# Patient Record
Sex: Female | Born: 1955 | Race: White | Hispanic: No | Marital: Married | Smoking: Current every day smoker
Health system: Southern US, Community
[De-identification: ages and names within clinical notes are randomized; demographics above are authoritative.]

## PROBLEM LIST (undated history)

## (undated) ENCOUNTER — Emergency Department: Discharge status: Absent without leave | Payer: Medicaid Other

## (undated) DIAGNOSIS — J349 Unspecified disorder of nose and nasal sinuses: Secondary | ICD-10-CM

## (undated) DIAGNOSIS — F410 Panic disorder [episodic paroxysmal anxiety] without agoraphobia: Secondary | ICD-10-CM

## (undated) DIAGNOSIS — K319 Disease of stomach and duodenum, unspecified: Secondary | ICD-10-CM

## (undated) DIAGNOSIS — E785 Hyperlipidemia, unspecified: Secondary | ICD-10-CM

## (undated) DIAGNOSIS — F32A Depression, unspecified: Secondary | ICD-10-CM

## (undated) DIAGNOSIS — K210 Gastro-esophageal reflux disease with esophagitis: Secondary | ICD-10-CM

## (undated) DIAGNOSIS — M797 Fibromyalgia: Secondary | ICD-10-CM

## (undated) DIAGNOSIS — F329 Major depressive disorder, single episode, unspecified: Secondary | ICD-10-CM

## (undated) DIAGNOSIS — K219 Gastro-esophageal reflux disease without esophagitis: Secondary | ICD-10-CM

## (undated) DIAGNOSIS — F419 Anxiety disorder, unspecified: Secondary | ICD-10-CM

## (undated) DIAGNOSIS — K589 Irritable bowel syndrome without diarrhea: Secondary | ICD-10-CM

## (undated) HISTORY — DX: Gastro-esophageal reflux disease without esophagitis: K21.9

## (undated) HISTORY — DX: Panic disorder (episodic paroxysmal anxiety): F41.0

## (undated) HISTORY — DX: Major depressive disorder, single episode, unspecified: F32.9

## (undated) HISTORY — PX: ABDOMINAL HYSTERECTOMY: SHX81

## (undated) HISTORY — DX: Hyperlipidemia, unspecified: E78.5

## (undated) HISTORY — PX: HEMORRHOID SURGERY: SHX153

## (undated) HISTORY — DX: Fibromyalgia: M79.7

## (undated) HISTORY — DX: Gastro-esophageal reflux disease with esophagitis: K21.0

## (undated) HISTORY — PX: ACHILLES TENDON REPAIR: SUR1153

## (undated) HISTORY — DX: Disease of stomach and duodenum, unspecified: K31.9

## (undated) HISTORY — PX: TONSILLECTOMY AND ADENOIDECTOMY: SUR1326

## (undated) HISTORY — DX: Depression, unspecified: F32.A

## (undated) HISTORY — DX: Irritable bowel syndrome without diarrhea: K58.9

## (undated) HISTORY — PX: KNEE ARTHROSCOPY: SHX127

## (undated) HISTORY — PX: SHOULDER ARTHROSCOPY: SHX128

---

## 1997-05-08 ENCOUNTER — Other Ambulatory Visit: Admission: RE | Admit: 1997-05-08 | Discharge: 1997-05-08 | Payer: Self-pay | Admitting: Sports Medicine

## 1999-06-18 ENCOUNTER — Other Ambulatory Visit: Admission: RE | Admit: 1999-06-18 | Discharge: 1999-06-18 | Payer: Self-pay | Admitting: Obstetrics and Gynecology

## 2000-08-10 ENCOUNTER — Ambulatory Visit (HOSPITAL_COMMUNITY): Admission: RE | Admit: 2000-08-10 | Discharge: 2000-08-10 | Payer: Self-pay | Admitting: Cardiology

## 2001-04-19 ENCOUNTER — Other Ambulatory Visit: Admission: RE | Admit: 2001-04-19 | Discharge: 2001-04-19 | Payer: Self-pay | Admitting: Obstetrics and Gynecology

## 2001-08-24 ENCOUNTER — Ambulatory Visit (HOSPITAL_BASED_OUTPATIENT_CLINIC_OR_DEPARTMENT_OTHER): Admission: RE | Admit: 2001-08-24 | Discharge: 2001-08-24 | Payer: Self-pay | Admitting: Orthopedic Surgery

## 2003-08-31 ENCOUNTER — Encounter: Payer: Self-pay | Admitting: Gastroenterology

## 2003-08-31 DIAGNOSIS — K2100 Gastro-esophageal reflux disease with esophagitis, without bleeding: Secondary | ICD-10-CM

## 2003-08-31 DIAGNOSIS — K219 Gastro-esophageal reflux disease without esophagitis: Secondary | ICD-10-CM

## 2003-08-31 HISTORY — DX: Gastro-esophageal reflux disease without esophagitis: K21.9

## 2003-08-31 HISTORY — DX: Gastro-esophageal reflux disease with esophagitis, without bleeding: K21.00

## 2006-08-18 ENCOUNTER — Ambulatory Visit (HOSPITAL_COMMUNITY): Admission: RE | Admit: 2006-08-18 | Discharge: 2006-08-18 | Payer: Self-pay | Admitting: Cardiovascular Disease

## 2008-06-05 ENCOUNTER — Telehealth: Payer: Self-pay | Admitting: Gastroenterology

## 2008-06-13 ENCOUNTER — Telehealth: Payer: Self-pay | Admitting: Gastroenterology

## 2008-06-14 DIAGNOSIS — F410 Panic disorder [episodic paroxysmal anxiety] without agoraphobia: Secondary | ICD-10-CM | POA: Insufficient documentation

## 2008-06-14 DIAGNOSIS — K210 Gastro-esophageal reflux disease with esophagitis: Secondary | ICD-10-CM

## 2008-06-14 DIAGNOSIS — R110 Nausea: Secondary | ICD-10-CM

## 2008-06-14 DIAGNOSIS — F411 Generalized anxiety disorder: Secondary | ICD-10-CM | POA: Insufficient documentation

## 2008-06-14 DIAGNOSIS — F329 Major depressive disorder, single episode, unspecified: Secondary | ICD-10-CM

## 2008-06-14 DIAGNOSIS — K219 Gastro-esophageal reflux disease without esophagitis: Secondary | ICD-10-CM | POA: Insufficient documentation

## 2008-06-14 DIAGNOSIS — IMO0001 Reserved for inherently not codable concepts without codable children: Secondary | ICD-10-CM

## 2008-06-15 ENCOUNTER — Ambulatory Visit: Payer: Self-pay | Admitting: Gastroenterology

## 2008-07-25 ENCOUNTER — Ambulatory Visit: Payer: Self-pay | Admitting: Gastroenterology

## 2008-07-25 DIAGNOSIS — E785 Hyperlipidemia, unspecified: Secondary | ICD-10-CM

## 2008-07-25 DIAGNOSIS — H9209 Otalgia, unspecified ear: Secondary | ICD-10-CM | POA: Insufficient documentation

## 2008-07-25 DIAGNOSIS — R197 Diarrhea, unspecified: Secondary | ICD-10-CM

## 2008-07-25 DIAGNOSIS — R1110 Vomiting, unspecified: Secondary | ICD-10-CM

## 2008-07-25 DIAGNOSIS — K589 Irritable bowel syndrome without diarrhea: Secondary | ICD-10-CM | POA: Insufficient documentation

## 2008-07-25 DIAGNOSIS — R079 Chest pain, unspecified: Secondary | ICD-10-CM

## 2008-07-26 ENCOUNTER — Telehealth: Payer: Self-pay | Admitting: Gastroenterology

## 2008-07-26 LAB — CONVERTED CEMR LAB
AST: 18 units/L (ref 0–37)
BUN: 9 mg/dL (ref 6–23)
Basophils Relative: 0.1 % (ref 0.0–3.0)
Bilirubin, Direct: 0.1 mg/dL (ref 0.0–0.3)
CO2: 30 meq/L (ref 19–32)
Eosinophils Relative: 2.2 % (ref 0.0–5.0)
Ferritin: 18.2 ng/mL (ref 10.0–291.0)
Glucose, Bld: 94 mg/dL (ref 70–99)
HCT: 43.2 % (ref 36.0–46.0)
Lymphocytes Relative: 17.3 % (ref 12.0–46.0)
Lymphs Abs: 1.5 10*3/uL (ref 0.7–4.0)
MCV: 92.7 fL (ref 78.0–100.0)
Monocytes Absolute: 0.2 10*3/uL (ref 0.1–1.0)
Monocytes Relative: 2.8 % — ABNORMAL LOW (ref 3.0–12.0)
Neutro Abs: 6.7 10*3/uL (ref 1.4–7.7)
Neutrophils Relative %: 77.6 % — ABNORMAL HIGH (ref 43.0–77.0)
Platelets: 275 10*3/uL (ref 150.0–400.0)
RBC: 4.66 M/uL (ref 3.87–5.11)
Saturation Ratios: 20 % (ref 20.0–50.0)
Sodium: 144 meq/L (ref 135–145)
Total Bilirubin: 0.8 mg/dL (ref 0.3–1.2)
Total Protein: 7.1 g/dL (ref 6.0–8.3)
Transferrin: 297.1 mg/dL (ref 212.0–360.0)

## 2008-08-14 ENCOUNTER — Telehealth: Payer: Self-pay | Admitting: Gastroenterology

## 2008-08-17 ENCOUNTER — Telehealth: Payer: Self-pay | Admitting: Gastroenterology

## 2008-09-01 ENCOUNTER — Telehealth: Payer: Self-pay | Admitting: Gastroenterology

## 2008-09-06 ENCOUNTER — Encounter: Payer: Self-pay | Admitting: Gastroenterology

## 2008-09-06 ENCOUNTER — Ambulatory Visit: Payer: Self-pay | Admitting: Gastroenterology

## 2008-09-06 DIAGNOSIS — K319 Disease of stomach and duodenum, unspecified: Secondary | ICD-10-CM

## 2008-09-06 DIAGNOSIS — K2970 Gastritis, unspecified, without bleeding: Secondary | ICD-10-CM | POA: Insufficient documentation

## 2008-09-06 DIAGNOSIS — K2990 Gastroduodenitis, unspecified, without bleeding: Secondary | ICD-10-CM

## 2008-09-06 HISTORY — DX: Disease of stomach and duodenum, unspecified: K31.9

## 2008-09-07 ENCOUNTER — Telehealth: Payer: Self-pay | Admitting: Gastroenterology

## 2008-09-07 ENCOUNTER — Telehealth (INDEPENDENT_AMBULATORY_CARE_PROVIDER_SITE_OTHER): Payer: Self-pay

## 2008-09-08 ENCOUNTER — Encounter: Payer: Self-pay | Admitting: Gastroenterology

## 2008-09-11 ENCOUNTER — Telehealth: Payer: Self-pay | Admitting: Gastroenterology

## 2008-09-12 ENCOUNTER — Telehealth: Payer: Self-pay | Admitting: Gastroenterology

## 2009-04-03 ENCOUNTER — Encounter: Admission: RE | Admit: 2009-04-03 | Discharge: 2009-04-03 | Payer: Self-pay | Admitting: Family Medicine

## 2009-04-03 ENCOUNTER — Encounter: Admission: RE | Admit: 2009-04-03 | Discharge: 2009-04-03 | Payer: Self-pay | Admitting: Physician Assistant

## 2009-04-05 ENCOUNTER — Encounter: Admission: RE | Admit: 2009-04-05 | Discharge: 2009-04-05 | Payer: Self-pay | Admitting: Family Medicine

## 2009-09-05 ENCOUNTER — Encounter: Admission: RE | Admit: 2009-09-05 | Discharge: 2009-09-05 | Payer: Self-pay | Admitting: Physician Assistant

## 2010-02-10 ENCOUNTER — Encounter: Payer: Self-pay | Admitting: Physician Assistant

## 2010-04-29 ENCOUNTER — Other Ambulatory Visit: Payer: Self-pay | Admitting: Family Medicine

## 2010-05-30 ENCOUNTER — Other Ambulatory Visit: Payer: Self-pay | Admitting: Physician Assistant

## 2010-05-30 DIAGNOSIS — Z1231 Encounter for screening mammogram for malignant neoplasm of breast: Secondary | ICD-10-CM

## 2010-05-30 DIAGNOSIS — Z09 Encounter for follow-up examination after completed treatment for conditions other than malignant neoplasm: Secondary | ICD-10-CM

## 2010-06-04 NOTE — Cardiovascular Report (Signed)
Bianca Reilly, Bianca Reilly               ACCOUNT NO.:  [id number]   MEDICAL RECORD NO.:  [id number]          PATIENT TYPE:  OIB   LOCATION:  2852                         FACILITY:  [facility]   PHYSICIAN:  Ricki Rodriguez, M.D.  DATE OF BIRTH:  Jul 18, 1955   DATE OF PROCEDURE:  08/18/2006  DATE OF DISCHARGE:                            CARDIAC CATHETERIZATION   Procedure done by Dr. Orpah Cobb.   PROCEDURE:  1. Left heart catheterization.  2. Selective coronary angiography.  3. Left renal function study.   INDICATION:  This is a 55 year old white female with a recurrent chest  pain for two weeks with family history of coronary artery disease and  abnormal EKG and smoking as the risk factors.   APPROACH:  Right femoral artery using a 4-French sheath.   COMPLICATIONS:  None.   Less than 55 mL of dye was used.   HEMODYNAMIC DATA:  The left ventricle pressure was 152/14 and aortic  pressure was 158/82.   LEFT VENTRICULOGRAM:  The left ventriculogram showed normal left  ventricular systolic function with ejection fraction of 60-65%.   CORONARY ANATOMY:  The left main coronary artery was unremarkable.   Left Anterior Descending Coronary Artery:  The anterior descending  coronary artery showed lumen irregularities in the proximal 1/3 of the  vessel.  The rest of the vessel was unremarkable.  Each diagonal branch  was also unremarkable.   Left Circumflex Coronary Artery:  The left circumflex coronary artery  was small and unremarkable.  Its obtuse marginal branch 1 and 2 were  normal.   Right Coronary Artery:  The right coronary artery was dominant and  normal and its posterolateral branch was a large vessel and posterior  descending coronary artery was also unremarkable.   IMPRESSION:  1. Minimal coronary artery disease.  2. Normal left renal systolic function.   RECOMMENDATIONS:  This patient will be treated medically for her  noncardiac chest pain.      Ricki Rodriguez,  M.D.  Electronically Signed     ASK/MEDQ  D:  08/18/2006  T:  08/18/2006  Job:  [id number]

## 2010-06-11 ENCOUNTER — Ambulatory Visit
Admission: RE | Admit: 2010-06-11 | Discharge: 2010-06-11 | Disposition: A | Discharge status: Home / Self Care | Payer: Medicaid Other | Source: Ambulatory Visit | Attending: Physician Assistant | Admitting: Physician Assistant

## 2010-06-11 ENCOUNTER — Ambulatory Visit: Payer: Self-pay

## 2010-06-11 ENCOUNTER — Other Ambulatory Visit: Payer: Self-pay | Admitting: Physician Assistant

## 2010-06-11 DIAGNOSIS — Z09 Encounter for follow-up examination after completed treatment for conditions other than malignant neoplasm: Secondary | ICD-10-CM

## 2010-11-04 LAB — HEMOGLOBIN A1C
Hgb A1c MFr Bld: 5.5
Mean Plasma Glucose: 119

## 2010-11-04 LAB — CBC
HCT: 40.1
Hemoglobin: 13.9
MCHC: 34.5
MCV: 91.1
RBC: 4.4
WBC: 8.1

## 2010-11-04 LAB — BASIC METABOLIC PANEL
CO2: 25
Chloride: 107
GFR calc Af Amer: >60
Potassium: 3.8

## 2010-11-04 LAB — LIPID PANEL
Total CHOL/HDL Ratio: 4.6
VLDL: 20

## 2010-11-04 LAB — PROTIME-INR
INR: 0.9
Prothrombin Time: 12.7

## 2011-07-01 ENCOUNTER — Emergency Department (HOSPITAL_COMMUNITY)
Admission: EM | Admit: 2011-07-01 | Discharge: 2011-07-02 | Disposition: A | Discharge status: Home / Self Care | Payer: Medicaid - Out of State | Attending: Emergency Medicine | Admitting: Emergency Medicine

## 2011-07-01 ENCOUNTER — Emergency Department (HOSPITAL_COMMUNITY): Payer: Medicaid - Out of State

## 2011-07-01 ENCOUNTER — Encounter (HOSPITAL_COMMUNITY): Payer: Self-pay | Admitting: *Deleted

## 2011-07-01 DIAGNOSIS — J329 Chronic sinusitis, unspecified: Secondary | ICD-10-CM | POA: Insufficient documentation

## 2011-07-01 DIAGNOSIS — E7800 Pure hypercholesterolemia, unspecified: Secondary | ICD-10-CM | POA: Insufficient documentation

## 2011-07-01 DIAGNOSIS — J069 Acute upper respiratory infection, unspecified: Secondary | ICD-10-CM | POA: Insufficient documentation

## 2011-07-01 DIAGNOSIS — F17200 Nicotine dependence, unspecified, uncomplicated: Secondary | ICD-10-CM | POA: Insufficient documentation

## 2011-07-01 HISTORY — DX: Anxiety disorder, unspecified: F41.9

## 2011-07-01 NOTE — ED Notes (Signed)
Pt called from triage to draw labs but no answer.

## 2011-07-01 NOTE — ED Notes (Signed)
Pt states has been weak for months; c/o heart feeling like it isn't beating right; c/o acid reflux; pt c/o heart racing; pain across front of chest and radiating to back; states she is concerned about bronchitis/pneumonia

## 2011-07-02 NOTE — ED Provider Notes (Signed)
History     CSN: [id number]  Arrival date & time 07/01/11  2300   None     Chief Complaint  Patient presents with  . Chest Pain    (Consider location/radiation/quality/duration/timing/severity/associated sxs/prior treatment) The history is provided by the patient.   has been feeling sick the last few weeks and worse her last few days having cough, sinus congestion, with history of bronchitis and symptoms feel the same. Subjective fevers at home with no measured temperature. Taking Mucinex and ibuprofen without relief. States last time she had the symptoms was put on antibiotics that helped. Moderate in severity. No shortness of breath. Has some chest pain with coughing but no pain otherwise. Pain is sharp in quality and occasionally radiates to her back. No hemoptysis. No nausea vomiting or diarrhea. No sore throat. No voice changes. In the emergency with family member who is also sick.  Past Medical History  Diagnosis Date  . Hypercholesteremia   . Anxiety     Past Surgical History  Procedure Date  . Knee arthroscopy   . Achilles tendon repair   . Abdominal hysterectomy   . Tonsillectomy   . Shoulder arthroscopy     No family history on file.  History  Substance Use Topics  . Smoking status: Current Everyday Smoker -- 1.0 packs/day  . Smokeless tobacco: Not on file  . Alcohol Use: No    OB History    Grav Para Term Preterm Abortions TAB SAB Ect Mult Living                  Review of Systems  Constitutional: Negative for fever and chills.  HENT: Positive for congestion and sinus pressure. Negative for ear pain, nosebleeds, sore throat, trouble swallowing, neck pain, neck stiffness and voice change.   Eyes: Negative for pain.  Respiratory: Positive for cough. Negative for shortness of breath.   Cardiovascular: Positive for chest pain.  Gastrointestinal: Negative for abdominal pain.  Genitourinary: Negative for dysuria.  Musculoskeletal: Negative for back pain.    Skin: Negative for rash.  Neurological: Negative for headaches.  All other systems reviewed and are negative.    Allergies  Ivp dye  Home Medications   Current Outpatient Rx  Name Route Sig Dispense Refill  . IBUPROFEN 800 MG PO TABS Oral Take 800 mg by mouth every 8 (eight) hours as needed. For pain    . PSEUDOEPHEDRINE-GUAIFENESIN ER 60-600 MG PO TB12 Oral Take 1 tablet by mouth every 12 (twelve) hours.      BP 131/74  Pulse 80  Temp 98.9 F (37.2 C) (Oral)  Resp 20  Wt 133 lb (60.328 kg)  SpO2 98%  Physical Exam  Constitutional: She is oriented to person, place, and time. She appears well-developed and well-nourished.  HENT:  Head: Normocephalic and atraumatic.  Mouth/Throat: Oropharynx is clear and moist. No oropharyngeal exudate.       Tender over maxillary sinuses, nasal congestion  Eyes: Conjunctivae and EOM are normal. Pupils are equal, round, and reactive to light.  Neck: Trachea normal. Neck supple. No thyromegaly present.  Cardiovascular: Normal rate, regular rhythm, S1 normal, S2 normal and normal pulses.     No systolic murmur is present   No diastolic murmur is present  Pulses:      Radial pulses are 2+ on the right side, and 2+ on the left side.  Pulmonary/Chest: Effort normal and breath sounds normal. She has no wheezes. She has no rhonchi. She has no rales. She  exhibits no tenderness.  Abdominal: Soft. Normal appearance and bowel sounds are normal. There is no tenderness. There is no CVA tenderness and negative Murphy's sign.  Musculoskeletal:       BLE:s Calves nontender, no cords or erythema, negative Homans sign  Neurological: She is alert and oriented to person, place, and time. She has normal strength. No cranial nerve deficit or sensory deficit. GCS eye subscore is 4. GCS verbal subscore is 5. GCS motor subscore is 6.  Skin: Skin is warm and dry. No rash noted. She is not diaphoretic.  Psychiatric: Her speech is normal.       Cooperative and  appropriate    ED Course  Procedures (including critical care time)   Labs Reviewed  LAB REPORT - SCANNED   Dg Chest 2 View  07/02/2011  *RADIOLOGY REPORT*  Clinical Data: Chest pain.  Smoker.  Shortness of breath for several days.  CHEST - 2 VIEW  Comparison: 04/03/2009  Findings: Midline trachea.  Normal heart size and mediastinal contours. No pleural effusion or pneumothorax.  Diffuse peribronchial thickening.  Apparent nodular density projecting over the right upper lobe is likely associated with an EKG lead.  The left lung is clear.  IMPRESSION:  1. No acute cardiopulmonary disease. 2. Peribronchial thickening which may relate to chronic bronchitis or smoking. 3.  Probable artifactual density projecting over the right upper lobe.  Consider repeat frontal film after removal of EKG leads.  Original Report Authenticated By: Consuello Bossier, M.D.     Date: 07/02/2011  Rate: 77  Rhythm: normal sinus rhythm  QRS Axis: normal  Intervals: normal  ST/T Wave abnormalities: nonspecific ST changes  Conduction Disutrbances:none  Narrative Interpretation: artifact present  Old EKG Reviewed: none available     MDM   Clinical sinusitis/ URI.  History of bronchitis. Active smoker. EKG and x-ray reviewed as above. No pneumonia. Symptoms do not suggest ACS. Prescriptions provided. No hypoxia or indication for admission or further workup at this time. Plan outpatient followup as needed        Sunnie Nielsen, MD 07/03/11 [phone]

## 2011-07-02 NOTE — ED Notes (Signed)
Pt called from triage for lab draw but no answer x2

## 2011-07-02 NOTE — ED Notes (Signed)
See paper chart 

## 2011-07-15 ENCOUNTER — Telehealth: Payer: Self-pay | Admitting: Gastroenterology

## 2011-07-15 ENCOUNTER — Ambulatory Visit: Payer: Medicaid Other | Admitting: Gastroenterology

## 2011-07-15 NOTE — Telephone Encounter (Signed)
Dr Jarold Motto, would you like to charge late cancellation fee?

## 2011-07-15 NOTE — Telephone Encounter (Signed)
yes

## 2011-08-05 ENCOUNTER — Other Ambulatory Visit: Payer: Self-pay | Admitting: Family Medicine

## 2011-08-05 DIAGNOSIS — R921 Mammographic calcification found on diagnostic imaging of breast: Secondary | ICD-10-CM

## 2011-08-12 ENCOUNTER — Encounter: Payer: Self-pay | Admitting: *Deleted

## 2011-08-12 ENCOUNTER — Ambulatory Visit
Admission: RE | Admit: 2011-08-12 | Discharge: 2011-08-12 | Disposition: A | Discharge status: Home / Self Care | Payer: Medicaid Other | Source: Ambulatory Visit | Attending: Family Medicine | Admitting: Family Medicine

## 2011-08-12 DIAGNOSIS — R921 Mammographic calcification found on diagnostic imaging of breast: Secondary | ICD-10-CM

## 2011-08-15 ENCOUNTER — Ambulatory Visit (INDEPENDENT_AMBULATORY_CARE_PROVIDER_SITE_OTHER): Payer: Medicaid Other | Admitting: Gastroenterology

## 2011-08-15 ENCOUNTER — Other Ambulatory Visit: Payer: Self-pay | Admitting: Gastroenterology

## 2011-08-15 ENCOUNTER — Ambulatory Visit: Payer: Medicaid Other

## 2011-08-15 ENCOUNTER — Other Ambulatory Visit: Payer: Self-pay

## 2011-08-15 ENCOUNTER — Encounter: Payer: Self-pay | Admitting: Gastroenterology

## 2011-08-15 VITALS — BP 100/70 | HR 80 | Ht 63.0 in | Wt 136.0 lb

## 2011-08-15 DIAGNOSIS — R1110 Vomiting, unspecified: Secondary | ICD-10-CM

## 2011-08-15 DIAGNOSIS — R109 Unspecified abdominal pain: Secondary | ICD-10-CM

## 2011-08-15 DIAGNOSIS — R110 Nausea: Secondary | ICD-10-CM

## 2011-08-15 DIAGNOSIS — R197 Diarrhea, unspecified: Secondary | ICD-10-CM

## 2011-08-15 DIAGNOSIS — K589 Irritable bowel syndrome without diarrhea: Secondary | ICD-10-CM

## 2011-08-15 LAB — CBC WITH DIFFERENTIAL/PLATELET
Basophils Absolute: 0.1 10*3/uL (ref 0.0–0.1)
Eosinophils Absolute: 0.2 10*3/uL (ref 0.0–0.7)
Hemoglobin: 14.9 g/dL (ref 12.0–15.0)
Lymphocytes Relative: 22.6 % (ref 12.0–46.0)
Lymphs Abs: 1.8 10*3/uL (ref 0.7–4.0)
MCHC: 34.1 g/dL (ref 30.0–36.0)
Neutro Abs: 5.3 10*3/uL (ref 1.4–7.7)
Platelets: 347 10*3/uL (ref 150.0–400.0)
RDW: 13.3 % (ref 11.5–14.6)

## 2011-08-15 LAB — FOLATE: Folate: 9.7 ng/mL (ref >5.9)

## 2011-08-15 LAB — BASIC METABOLIC PANEL
BUN: 10 mg/dL (ref 6–23)
CO2: 27 mEq/L (ref 19–32)
Calcium: 9.8 mg/dL (ref 8.4–10.5)
Glucose, Bld: 65 mg/dL — ABNORMAL LOW (ref 70–99)
Sodium: 142 mEq/L (ref 135–145)

## 2011-08-15 LAB — C-REACTIVE PROTEIN: CRP: <1 mg/dL (ref 1–20)

## 2011-08-15 LAB — HEPATIC FUNCTION PANEL
Albumin: 4.4 g/dL (ref 3.5–5.2)
Total Protein: 7.6 g/dL (ref 6.0–8.3)

## 2011-08-15 LAB — SEDIMENTATION RATE: Sed Rate: 19 mm/hr (ref 0–22)

## 2011-08-15 LAB — TSH: TSH: 2.78 u[IU]/mL (ref 0.35–5.50)

## 2011-08-15 MED ORDER — CILIDINIUM-CHLORDIAZEPOXIDE 2.5-5 MG PO CAPS: 1.0000 | ORAL_CAPSULE | Freq: Three times a day (TID) | ORAL | Status: DC | PRN

## 2011-08-15 MED ORDER — MOVIPREP 100 G PO SOLR: 1.0000 | ORAL | Status: DC

## 2011-08-15 NOTE — Patient Instructions (Addendum)
You have been given a separate informational sheet regarding your tobacco use, the importance of quitting and local resources to help you quit. Your prescriptions have been sent to your pharmacy. You have been scheduled for an Endoscopy and Colonoscopy, instructions have been provided. Labs will be done today. Fodmap has been given to you for your review.

## 2011-08-15 NOTE — Progress Notes (Signed)
History of Present Illness:  This is a 56 year old Caucasian female with chronic IBS who has not been seen in several years. She now presents with crampy lower abdominal pain, watery diarrhea, gas and bloating. She also complains of refractory acid reflux symptoms but no dysphagia or hepatobiliary complaints. She has a chronic anxiety syndrome, but is not on medications at this time. I reviewed her previous workup, x-rays, endoscopies and clinical notes. Biopsy of her small intestine in the past to show no evidence of celiac disease, and random colon biopsies have not shown evidence of microscopic or collagenous colitis. Her appetite is good her weight is stable. She denies any systemic complaints such as fever, chills, skin rashes etc. Throughout the exam and interview her daughter was present.  I have reviewed this patient's present history, medical and surgical past history, allergies and medications.     ROS: The remainder of the 10 point ROS is negative     Physical Exam: Blood pressure 100/70, pulse 80 and regular, and weight 136 pounds with BMI of 24.09.  General well developed well nourished patient in no acute distress, appearing older than her stated age Eyes PERRLA, no icterus, fundoscopic exam per opthamologist Skin no lesions noted Neck supple, no adenopathy, no thyroid enlargement, no tenderness Chest clear to percussion and auscultation Heart no significant murmurs, gallops or rubs noted Abdomen no hepatosplenomegaly masses or tenderness, BS normal.  Rectal inspection normal no fissures, or fistulae noted.  No masses or tenderness on digital exam. Stool guaiac negative. Extremities no acute joint lesions, edema, phlebitis or evidence of cellulitis. Neurologic patient oriented x 3, cranial nerves intact, no focal neurologic deficits noted. Psychological mental status normal and normal affect.  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 I have placed her on a FODMAP-IBS diet, prescribed Librax 3 times a day, and will do followup endoscopy and colonoscopy. She complains of chronic fatigue, and apparently see multiple physicians, and is very frustrated about her condition. To be complete we will do these endoscopic studies, and also labs ordered for review including an anemia profile. She may need referral to psychology for anxiety control.  Encounter Diagnoses  Name Primary?  . Nausea Yes  . Diarrhea   . Abdominal pain

## 2011-08-18 ENCOUNTER — Telehealth: Payer: Self-pay | Admitting: Gastroenterology

## 2011-08-18 NOTE — Telephone Encounter (Signed)
Patient advised. I have mailed her a copy of her labs at her request

## 2011-08-21 LAB — GLIA (IGA/G) + TTG IGA
Gliadin IgA: 2.1 U/mL (ref <20)
Gliadin IgG: 3.6 U/mL (ref <20)

## 2011-08-25 ENCOUNTER — Ambulatory Visit: Payer: Self-pay | Admitting: Gastroenterology

## 2011-08-26 ENCOUNTER — Other Ambulatory Visit: Payer: Self-pay | Admitting: *Deleted

## 2011-09-05 ENCOUNTER — Ambulatory Visit: Payer: Self-pay | Admitting: Gastroenterology

## 2011-09-09 ENCOUNTER — Telehealth: Payer: Self-pay | Admitting: Gastroenterology

## 2011-09-09 NOTE — Telephone Encounter (Signed)
Pt called in as she is feeling like her blood sugar is dropping and she feels nervous about it. When questioning her about further information she states she feels dizzy and shaky. She reports she has only had 2-3 glasses of fluid today. I reviewed with her that she needs to increase her fluid intake tremendously, we recommend at least 8-8 ounce glasses of fluid, at least and she needs something that will give her some fuel energy since she is not eating, like kool aid, gatorade, soda, juices, jello, popcicles, hard candy...etc. She states she will get on that and asked if we might have an earlier appointment. At this time the schedule is full but I told her if we had any cancellations we would attempt to call her. Also encouraged her to call the on call physician should she have any additional concerns or issues.

## 2011-09-10 ENCOUNTER — Other Ambulatory Visit: Payer: Self-pay | Admitting: *Deleted

## 2011-09-10 ENCOUNTER — Encounter: Payer: Self-pay | Admitting: Gastroenterology

## 2011-09-10 ENCOUNTER — Ambulatory Visit (AMBULATORY_SURGERY_CENTER): Payer: Medicaid Other | Admitting: Gastroenterology

## 2011-09-10 VITALS — BP 126/74 | HR 70 | Temp 96.9°F | Resp 20 | Ht 63.0 in | Wt 136.0 lb

## 2011-09-10 DIAGNOSIS — K589 Irritable bowel syndrome without diarrhea: Secondary | ICD-10-CM

## 2011-09-10 DIAGNOSIS — K2970 Gastritis, unspecified, without bleeding: Secondary | ICD-10-CM

## 2011-09-10 DIAGNOSIS — Z1211 Encounter for screening for malignant neoplasm of colon: Secondary | ICD-10-CM

## 2011-09-10 DIAGNOSIS — R197 Diarrhea, unspecified: Secondary | ICD-10-CM

## 2011-09-10 DIAGNOSIS — K219 Gastro-esophageal reflux disease without esophagitis: Secondary | ICD-10-CM

## 2011-09-10 DIAGNOSIS — K2990 Gastroduodenitis, unspecified, without bleeding: Secondary | ICD-10-CM

## 2011-09-10 DIAGNOSIS — R079 Chest pain, unspecified: Secondary | ICD-10-CM

## 2011-09-10 DIAGNOSIS — D126 Benign neoplasm of colon, unspecified: Secondary | ICD-10-CM

## 2011-09-10 DIAGNOSIS — R109 Unspecified abdominal pain: Secondary | ICD-10-CM

## 2011-09-10 DIAGNOSIS — R110 Nausea: Secondary | ICD-10-CM

## 2011-09-10 MED ORDER — DEXLANSOPRAZOLE 60 MG PO CPDR: DELAYED_RELEASE_CAPSULE | ORAL | Status: DC

## 2011-09-10 MED ORDER — SODIUM CHLORIDE 0.9 % IV SOLN: 500.0000 mL | INTRAVENOUS | Status: DC

## 2011-09-10 MED ORDER — DICYCLOMINE HCL 10 MG PO CAPS: 10.0000 mg | ORAL_CAPSULE | Freq: Three times a day (TID) | ORAL | Status: DC

## 2011-09-10 NOTE — Op Note (Signed)
[facility] [address] [place], [address]   ENDOSCOPY PROCEDURE REPORT  PATIENT: Bianca Reilly, Bianca Reilly  MR#: [id number] BIRTHDATE: 05/21/1955 , 56  yrs. old GENDER: Female ENDOSCOPIST:Netanel Yannuzzi Hale Bogus, MD, [facility] REFERRED BY: PROCEDURE DATE:  09/10/2011 PROCEDURE:   EGD w/ biopsy and EGD w/ biopsy for H.pylori ASA CLASS:    Class II INDICATIONS: heartburn and nausea. MEDICATION: There was residual sedation effect present from prior procedure and Propofol (Diprivan) 150 mg IV TOPICAL ANESTHETIC:   Cetacaine Spray  DESCRIPTION OF PROCEDURE:   After the risks and benefits of the procedure were explained, informed consent was obtained.  The LB GIF-H180 [id number]  endoscope was introduced through the mouth  and advanced to the second portion of the duodenum .  The instrument was slowly withdrawn as the mucosa was fully examined.      DUODENUM: The duodenal mucosa showed no abnormalities in the 2nd part of the duodenum.  Cold forcep biopsies were taken in the second portion.  STOMACH: Abnormal mucosa was found on the greater curvature of the gastric antrum.  The mucosa had erosions.   CLO bx. done...  ESOPHAGUS: There was evidence of suspected Barrett's esophagus in the lower third of the esophagus.  Multiple biopsies were performed.    Retroflexed views revealed no abnormalities.    The scope was then withdrawn from the patient and the procedure completed.  COMPLICATIONS: There were no complications.   ENDOSCOPIC IMPRESSION: 1.   The duodenal mucosa showed no abnormalities in the 2nd part of the duodenum 2.   Abnormal mucosa was found on the greater curvature of the gastric antrum; The mucosa had erosions 3.   There was evidence of suspected Barrett's esophagus; multiple biopsies  RECOMMENDATIONS: 1.  Await pathology results 2.  Anti-reflux regimen to be follow 3.  continue PPI 4.  Rx CLO if positive    _______________________________ eSigned:   Mardella Layman, MD, [facility] 09/10/2011 2:37 PM

## 2011-09-10 NOTE — Op Note (Signed)
[facility] [address] [place], [address]   COLONOSCOPY PROCEDURE REPORT  PATIENT: Bianca Reilly, Bianca Reilly  MR#: [id number] BIRTHDATE: Jan 21, 1955 , 56  yrs. old GENDER: Female ENDOSCOPIST: Mardella Layman, MD, [facility] REFERRED BY: PROCEDURE DATE:  09/10/2011 PROCEDURE:   Colonoscopy with biopsy and Colonoscopy with hot biopsy/bipolar ASA CLASS:   Class II INDICATIONS:average risk patient for colon cancer, change in bowel habits, and unexplained diarrhea. MEDICATIONS: propofol (Diprivan) 250mg  IV  DESCRIPTION OF PROCEDURE:   After the risks and benefits and of the procedure were explained, informed consent was obtained.  A digital rectal exam revealed no abnormalities of the rectum.    The LB [id number]  endoscope was introduced through the anus and advanced to the cecum, which was identified by both the appendix and ileocecal valve .  The quality of the prep was excellent, using MoviPrep .  The instrument was then slowly withdrawn as the colon was fully examined.     COLON FINDINGS: The colonic mucosa appeared normal throughout the entire examined colon.  Multiple biopsies were performed.   No polyps or cancer.     Retroflexed views revealed no abnormalities. The scope was then withdrawn from the patient and the procedure completed.  COMPLICATIONS: There were no complications. ENDOSCOPIC IMPRESSION: 1.   The colonic mucosa appeared normal throughout the entire examined colon; multiple biopsies were performed 2.   No polyps or cancer 3.   R/O MICROSCOPIC COLITIS  RECOMMENDATIONS: 1.  Await pathology results 2.  Upper Endoscopy   REPEAT EXAM:  AO:ZHYQ, Erin MD  _______________________________ eSigned:  Mardella Layman, MD, [facility] 09/10/2011 2:29 PM

## 2011-09-10 NOTE — Progress Notes (Signed)
Patient did not experience any of the following events: a burn prior to discharge; a fall within the facility; wrong site/side/patient/procedure/implant event; or a hospital transfer or hospital admission upon discharge from the facility. (G8907) Patient did not have preoperative order for IV antibiotic SSI prophylaxis. (G8918)  

## 2011-09-10 NOTE — Patient Instructions (Addendum)
YOU HAD AN ENDOSCOPIC PROCEDURE TODAY AT THE [facility]: Refer to the procedure report that was given to you for any specific questions about what was found during the examination.  If the procedure report does not answer your questions, please call your gastroenterologist to clarify.  If you requested that your care partner not be given the details of your procedure findings, then the procedure report has been included in a sealed envelope for you to review at your convenience later.  YOU SHOULD EXPECT: Some feelings of bloating in the abdomen. Passage of more gas than usual.  Walking can help get rid of the air that was put into your GI tract during the procedure and reduce the bloating. If you had a lower endoscopy (such as a colonoscopy or flexible sigmoidoscopy) you may notice spotting of blood in your stool or on the toilet paper. If you underwent a bowel prep for your procedure, then you may not have a normal bowel movement for a few days.  DIET: Your first meal following the procedure should be a light meal and then it is ok to progress to your normal diet.  A half-sandwich or bowl of soup is an example of a good first meal.  Heavy or fried foods are harder to digest and may make you feel nauseous or bloated.  Likewise meals heavy in dairy and vegetables can cause extra gas to form and this can also increase the bloating.  Drink plenty of fluids but you should avoid alcoholic beverages for 24 hours.  ACTIVITY: Your care partner should take you home directly after the procedure.  You should plan to take it easy, moving slowly for the rest of the day.  You can resume normal activity the day after the procedure however you should NOT DRIVE or use heavy machinery for 24 hours (because of the sedation medicines used during the test).    SYMPTOMS TO REPORT IMMEDIATELY: A gastroenterologist can be reached at any hour.  During normal business hours, 8:30 AM to 5:00 PM Monday through Friday,  call [phone].  After hours and on weekends, please call the GI answering service at [phone] who will take a message and have the physician on call contact you.   Following lower endoscopy (colonoscopy or flexible sigmoidoscopy):  Excessive amounts of blood in the stool  Significant tenderness or worsening of abdominal pains  Swelling of the abdomen that is new, acute  Fever of 100F or higher  Following upper endoscopy (EGD)  Vomiting of blood or coffee ground material  New chest pain or pain under the shoulder blades  Painful or persistently difficult swallowing  New shortness of breath  Fever of 100F or higher  Black, tarry-looking stools  FOLLOW UP: If any biopsies were taken you will be contacted by phone or by letter within the next 1-3 weeks.  Call your gastroenterologist if you have not heard about the biopsies in 3 weeks.  Our staff will call the home number listed on your records the next business day following your procedure to check on you and address any questions or concerns that you may have at that time regarding the information given to you following your procedure. This is a courtesy call and so if there is no answer at the home number and we have not heard from you through the emergency physician on call, we will assume that you have returned to your regular daily activities without incident.  SIGNATURES/CONFIDENTIALITY: You and/or your care   partner have signed paperwork which will be entered into your electronic medical record.  These signatures attest to the fact that that the information above on your After Visit Summary has been reviewed and is understood.  Full responsibility of the confidentiality of this discharge information lies with you and/or your care-partner. YOU HAD AN ENDOSCOPIC PROCEDURE TODAY AT THE [facility]: Refer to the procedure report that was given to you for any specific questions about what was found during the  examination.  If the procedure report does not answer your questions, please call your gastroenterologist to clarify.  If you requested that your care partner not be given the details of your procedure findings, then the procedure report has been included in a sealed envelope for you to review at your convenience later.  YOU SHOULD EXPECT: Some feelings of bloating in the abdomen. Passage of more gas than usual.  Walking can help get rid of the air that was put into your GI tract during the procedure and reduce the bloating. If you had a lower endoscopy (such as a colonoscopy or flexible sigmoidoscopy) you may notice spotting of blood in your stool or on the toilet paper. If you underwent a bowel prep for your procedure, then you may not have a normal bowel movement for a few days.  DIET: Your first meal following the procedure should be a light meal and then it is ok to progress to your normal diet.  A half-sandwich or bowl of soup is an example of a good first meal.  Heavy or fried foods are harder to digest and may make you feel nauseous or bloated.  Likewise meals heavy in dairy and vegetables can cause extra gas to form and this can also increase the bloating.  Drink plenty of fluids but you should avoid alcoholic beverages for 24 hours.  ACTIVITY: Your care partner should take you home directly after the procedure.  You should plan to take it easy, moving slowly for the rest of the day.  You can resume normal activity the day after the procedure however you should NOT DRIVE or use heavy machinery for 24 hours (because of the sedation medicines used during the test).    SYMPTOMS TO REPORT IMMEDIATELY: A gastroenterologist can be reached at any hour.  During normal business hours, 8:30 AM to 5:00 PM Monday through Friday, call [phone].  After hours and on weekends, please call the GI answering service at [phone] who will take a message and have the physician on call contact  you.   Following lower endoscopy (colonoscopy or flexible sigmoidoscopy):  Excessive amounts of blood in the stool  Significant tenderness or worsening of abdominal pains  Swelling of the abdomen that is new, acute  Fever of 100F or higher  Following upper endoscopy (EGD)  Vomiting of blood or coffee ground material  New chest pain or pain under the shoulder blades  Painful or persistently difficult swallowing  New shortness of breath  Fever of 100F or higher  Black, tarry-looking stools  FOLLOW UP: If any biopsies were taken you will be contacted by phone or by letter within the next 1-3 weeks.  Call your gastroenterologist if you have not heard about the biopsies in 3 weeks.  Our staff will call the home number listed on your records the next business day following your procedure to check on you and address any questions or concerns that you may have at that time regarding the information given to   you following your procedure. This is a courtesy call and so if there is no answer at the home number and we have not heard from you through the emergency physician on call, we will assume that you have returned to your regular daily activities without incident.  SIGNATURES/CONFIDENTIALITY: You and/or your care partner have signed paperwork which will be entered into your electronic medical record.  These signatures attest to the fact that that the information above on your After Visit Summary has been reviewed and is understood.  Full responsibility of the confidentiality of this discharge information lies with you and/or your care-partner.  

## 2011-09-11 ENCOUNTER — Telehealth: Payer: Self-pay | Admitting: Gastroenterology

## 2011-09-11 ENCOUNTER — Other Ambulatory Visit: Payer: Self-pay | Admitting: *Deleted

## 2011-09-11 ENCOUNTER — Telehealth: Payer: Self-pay | Admitting: *Deleted

## 2011-09-11 NOTE — Telephone Encounter (Signed)
At 1226, pt called up to the RR directly before staff had an opportunity to call her back.  Pt states, "I am so upset because my report said I have Barrett's esophagus.  I looked that up on the internet and I am devastated that I might have cancer."  Writer explained what Barrett's esophagus is and that we have to wait to see what the biopsies show exactly.  I also explained the treatment for Barrett's- monitoring the condition with EGDs and medication and that with treatment, it can be cared for.  She still has many questions about this condition.  I asked her if she would feel more comfortable discussing this with Dr. Jarold Motto at an office visit and she declined.  She asked if she has cancer repeatedly and I explained that Dr. Jarold Motto did not see any cancer during her procedure; he only took biopsies.  She states, "He only gave me samples; I am going to need lots and lots of pills if he expects me to be on this for the rest of my life." I told her that his office nurse would be the one to handle that and asked if she had more questions.  She did not and transferred call to Leta Baptist RN

## 2011-09-11 NOTE — Telephone Encounter (Signed)
  Follow up Call-  Call back number 09/10/2011  Post procedure Call Back phone  # [phone]  Permission to leave phone message Yes    [facility]

## 2011-09-11 NOTE — Telephone Encounter (Signed)
Pt called and left a phone note.  Then, she calle

## 2011-09-12 ENCOUNTER — Encounter: Payer: Self-pay | Admitting: Gastroenterology

## 2011-09-12 ENCOUNTER — Telehealth: Payer: Self-pay | Admitting: *Deleted

## 2011-09-12 MED ORDER — DEXLANSOPRAZOLE 60 MG PO CPDR: DELAYED_RELEASE_CAPSULE | ORAL | Status: DC

## 2011-09-12 NOTE — Telephone Encounter (Signed)
Pt's daughter called to report pt will be leaving next week for [place] and is really stressed thinking she has cancer because she has Barrett's. The daughter has tried to explain to her what Barrett's is and that we need to wait on the path report. Informed her I will call as soon as we receive the path. She also wanted the Dexilant ordered; she was given samples. Ordered med.

## 2011-09-15 ENCOUNTER — Telehealth: Payer: Self-pay | Admitting: *Deleted

## 2011-09-15 NOTE — Telephone Encounter (Signed)
Pt walked into the ofc requesting to talk with me about her bx results which still aren't back. She also asked about the Dexilant script. Informed her per CMA that the Dexilant was denied and she called the Pharmacist who stated Prilosec or pantoprazole would probably be covered. Explained to pt I will see if we have any samples and go from there. We may be able to give her enough to help heal her esophagus and then she can switch to  prilosec.

## 2011-09-16 ENCOUNTER — Encounter: Payer: Self-pay | Admitting: Gastroenterology

## 2011-09-16 ENCOUNTER — Telehealth: Payer: Self-pay | Admitting: *Deleted

## 2011-09-16 MED ORDER — DEXLANSOPRAZOLE 60 MG PO CPDR: DELAYED_RELEASE_CAPSULE | ORAL | Status: DC

## 2011-09-16 NOTE — Telephone Encounter (Signed)
She has not had Barrett's mucosa and does not need regular endoscopies.

## 2011-09-16 NOTE — Telephone Encounter (Signed)
Will leave samples of Dexilant at the front desk. Lu Duffel, CMA has sent a request for assistance to [facility] for the Dexilant. Dr Jarold Motto, per the path report, does this mean pt DOES NOT have Barrett's? Thanks.

## 2011-09-16 NOTE — Telephone Encounter (Signed)
Spoke with pt to explain her path showed no signs of Barrett's and she does not need regular endoscopies. Explained I will leave her samples of Dexilant at the front desk and we are trying to get her assistance for Dexilant; pt stated understanding.

## 2011-09-17 ENCOUNTER — Telehealth: Payer: Self-pay | Admitting: Gastroenterology

## 2011-09-17 MED ORDER — PANTOPRAZOLE SODIUM 40 MG PO TBEC: DELAYED_RELEASE_TABLET | ORAL | Status: DC

## 2011-09-17 NOTE — Telephone Encounter (Signed)
Pt called to thank [place] for the samples of Dexilant and asked that we call in a script for Pantoprazole for her after she completes the Dexilant; done.

## 2011-09-25 ENCOUNTER — Ambulatory Visit: Payer: Medicaid Other | Admitting: Gastroenterology

## 2011-09-26 ENCOUNTER — Encounter: Payer: Self-pay | Admitting: Gastroenterology

## 2011-09-26 ENCOUNTER — Telehealth: Payer: Self-pay | Admitting: *Deleted

## 2011-09-26 NOTE — Telephone Encounter (Signed)
Pt reports she is taking the samples of Dexilant and her voice has improved- I can hear the improvement over the phone- but now she has diarrhea. Advised her to stop the Dexilant and begin the pantoprazole. Pt states her nerves are torn up d/t the family moving status. She will call back for more questions/problems.

## 2011-09-30 ENCOUNTER — Ambulatory Visit: Payer: Medicaid Other | Admitting: Gastroenterology

## 2011-10-03 ENCOUNTER — Telehealth: Payer: Self-pay | Admitting: Gastroenterology

## 2011-10-06 NOTE — Telephone Encounter (Signed)
lmom for pt to call back

## 2011-10-06 NOTE — Telephone Encounter (Signed)
Her labs are all good. She no showed for her last clinic appointment. She should eat frequent small meals and use ensure supplementation. Most of this patient's symptoms are functional in nature, and have been chronic for over 20 years. Use of PPI agents is okay.

## 2011-10-06 NOTE — Telephone Encounter (Signed)
Pt reports she doesn't feel right; could her esophagus problems be causing her problems? We discussed the procedure and path and she probably has inflammation in her esophagus and hopefully the Dexilant should help; when she runs out, she should switch to Protonix. She pressed for her other causes of her fatigue. Dr Jarold Motto, can you review her labs again from 08/15/11 and advise? Thanks.

## 2011-10-08 NOTE — Telephone Encounter (Signed)
Informed pt of Dr Norval Gable findings. She reports she still feels very fatigued and is bothered by early satiety and diarrhea. She describes the diarrhea as frequent watery stools. She originally came to Dr Jarold Motto because her PCP told her she was anemic and then she called them today and they told her she was not anemic. Explained the Bentyl and Dexilant can cause diarrhea and they be the cause of her problems. Suggested she stop one or both of the drugs and see if she feels better. She can begin the pantoprazole now if she stops the Dexilant. Pt stated understanding.

## 2011-10-15 ENCOUNTER — Telehealth: Payer: Self-pay

## 2011-10-15 NOTE — Telephone Encounter (Signed)
After faxing 2 times and calling multiple times to [facility] # [phone] Claudia Pollock verified Dexilant was approved 09-25-2011 through 09-19-2012. I then notified Marchelle Folks at  [facility] [phone]. She processed Rx and it did go through.

## 2011-11-19 ENCOUNTER — Encounter: Payer: Self-pay | Admitting: Gastroenterology

## 2011-11-19 ENCOUNTER — Ambulatory Visit (INDEPENDENT_AMBULATORY_CARE_PROVIDER_SITE_OTHER): Payer: Medicaid Other | Admitting: Gastroenterology

## 2011-11-19 VITALS — BP 120/78 | HR 78 | Wt 139.2 lb

## 2011-11-19 DIAGNOSIS — R0789 Other chest pain: Secondary | ICD-10-CM

## 2011-11-19 DIAGNOSIS — K589 Irritable bowel syndrome without diarrhea: Secondary | ICD-10-CM

## 2011-11-19 DIAGNOSIS — F411 Generalized anxiety disorder: Secondary | ICD-10-CM

## 2011-11-19 DIAGNOSIS — F419 Anxiety disorder, unspecified: Secondary | ICD-10-CM

## 2011-11-19 DIAGNOSIS — K219 Gastro-esophageal reflux disease without esophagitis: Secondary | ICD-10-CM

## 2011-11-19 MED ORDER — RANITIDINE HCL 300 MG PO TABS: 300.0000 mg | ORAL_TABLET | Freq: Two times a day (BID) | ORAL | Status: AC

## 2011-11-19 NOTE — Patient Instructions (Addendum)
We have sent the following medications to your pharmacy for you to pick up at your convenience: Zantac.    You have been scheduled for an esophageal manometry at [facility] on 12/15/11 at 9:00am. Please arrive 30 minutes prior to your procedure for registration. You will need to go to outpatient registration (1st floor of the hospital) first. Make certain to bring your insurance cards as well as a complete list of medications.  Please remember the following:  1) Nothing to eat or drink after 12:00 midnight on the night before your test.  2) Hold all diabetic medications/insulin the morning of the test. You may eat and take your medications after the test.  3) For 3 days prior to your test do not take: Dexilant, Prevacid, Nexium, Protonix, Aciphex, Zegerid, Pantoprazole, Prilosec or omeprazole.  4) For 2 days prior to your test, do not take: Reglan, Tagamet, Zantac, Axid or Pepcid.  5) You MAY use an antacid such as Rolaids or Tums up to 12 hours prior to your test.  It will take at least 2 weeks to receive the results of this test from your physician. ------------------------------------------ ABOUT ESOPHAGEAL MANOMETRY Esophageal manometry (muh-NOM-uh-tree) is a test that gauges how well your esophagus works. Your esophagus is the long, muscular tube that connects your throat to your stomach. Esophageal manometry measures the rhythmic muscle contractions (peristalsis) that occur in your esophagus when you swallow. Esophageal manometry also measures the coordination and force exerted by the muscles of your esophagus.  During esophageal manometry, a thin, flexible tube (catheter) that contains sensors is passed through your nose, down your esophagus and into your stomach. Esophageal manometry can be helpful in diagnosing some mostly uncommon disorders that affect your esophagus.  Why it's done Esophageal manometry is used to evaluate the movement (motility) of food through the  esophagus and into the stomach. The test measures how well the circular bands of muscle (sphincters) at the top and bottom of your esophagus open and close, as well as the pressure, strength and pattern of the wave of esophageal muscle contractions that moves food along.  What you can expect Esophageal manometry is an outpatient procedure done without sedation. Most people tolerate it well. You may be asked to change into a hospital gown before the test starts.  During esophageal manometry  While you are sitting up, a member of your health care team sprays your throat with a numbing medication or puts numbing gel in your nose or both.  A catheter is guided through your nose into your esophagus. The catheter may be sheathed in a water-filled sleeve. It doesn't interfere with your breathing. However, your eyes may water, and you may gag. You may have a slight nosebleed from irritation.  After the catheter is in place, you may be asked to lie on your back on an exam table, or you may be asked to remain seated.  You then swallow small sips of water. As you do, a computer connected to the catheter records the pressure, strength and pattern of your esophageal muscle contractions.  During the test, you'll be asked to breathe slowly and smoothly, remain as still as possible, and swallow only when you're asked to do so.  A member of your health care team may move the catheter down into your stomach while the catheter continues its measurements.  The catheter then is slowly withdrawn. The test usually lasts 20 to 30 minutes.  After esophageal manometry  When your esophageal manometry is complete,  you may return to your normal activities  This test typically takes 30-45 minutes to complete. ________________________________________________________________________________
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
## 2011-12-12 ENCOUNTER — Telehealth: Payer: Self-pay | Admitting: Gastroenterology

## 2011-12-12 NOTE — Telephone Encounter (Signed)
lmom for pt to call back

## 2011-12-12 NOTE — Telephone Encounter (Signed)
Cancelled EM appt.

## 2011-12-15 ENCOUNTER — Encounter (HOSPITAL_COMMUNITY): Payer: Self-pay

## 2011-12-15 ENCOUNTER — Ambulatory Visit (HOSPITAL_COMMUNITY): Admit: 2011-12-15 | Payer: Medicaid Other | Admitting: Gastroenterology

## 2011-12-15 SURGERY — MANOMETRY, ESOPHAGUS

## 2011-12-23 NOTE — Telephone Encounter (Signed)
Pt never called back.

## 2012-01-09 ENCOUNTER — Other Ambulatory Visit: Payer: Self-pay | Admitting: Gastroenterology

## 2012-01-09 ENCOUNTER — Telehealth: Payer: Self-pay | Admitting: Gastroenterology

## 2012-01-09 NOTE — Telephone Encounter (Signed)
Pt reports she's in bad shape and wants to schedule her Manometry she cancelled in 11/2011. She reports diarrhea as well as pain in her stomach. Informed pt I know the EMs are way out into March, 2014 and she probably needs to see someone. Offered to schedule her with a mid level and she only wants to see Dr Jarold Motto; she will come 01/20/12.

## 2012-01-20 ENCOUNTER — Ambulatory Visit: Payer: Medicaid Other | Admitting: Gastroenterology

## 2012-01-28 ENCOUNTER — Encounter: Payer: Self-pay | Admitting: *Deleted

## 2012-02-03 ENCOUNTER — Encounter: Payer: Self-pay | Admitting: Gastroenterology

## 2012-02-03 ENCOUNTER — Ambulatory Visit (INDEPENDENT_AMBULATORY_CARE_PROVIDER_SITE_OTHER): Payer: Medicaid Other | Admitting: Gastroenterology

## 2012-02-03 VITALS — BP 120/70 | HR 80 | Ht 64.0 in | Wt 131.8 lb

## 2012-02-03 DIAGNOSIS — F428 Other obsessive-compulsive disorder: Secondary | ICD-10-CM

## 2012-02-03 DIAGNOSIS — R1310 Dysphagia, unspecified: Secondary | ICD-10-CM

## 2012-02-03 DIAGNOSIS — K929 Disease of digestive system, unspecified: Secondary | ICD-10-CM

## 2012-02-03 DIAGNOSIS — F9821 Rumination disorder of infancy: Secondary | ICD-10-CM

## 2012-02-03 DIAGNOSIS — K319 Disease of stomach and duodenum, unspecified: Secondary | ICD-10-CM

## 2012-02-03 MED ORDER — RANITIDINE HCL 150 MG PO TABS: 150.0000 mg | ORAL_TABLET | Freq: Two times a day (BID) | ORAL | Status: AC

## 2012-02-03 MED ORDER — DICYCLOMINE HCL 10 MG PO CAPS: 10.0000 mg | ORAL_CAPSULE | Freq: Three times a day (TID) | ORAL | Status: AC

## 2012-02-03 NOTE — Patient Instructions (Addendum)
You have been scheduled for an esophageal manometry at [facility] on 02/16/2012 at 1:00pm. Please arrive 30 minutes prior to your procedure for registration. You will need to go to outpatient registration (1st floor of the hospital) first. Make certain to bring your insurance cards as well as a complete list of medications.  Please remember the following:  1) Nothing to eat or drink after 12:00 midnight on the night before your test.  2) Hold all diabetic medications/insulin the morning of the test. You may eat and take             your medications after the test.  3) For 3 days prior to your test do not take: Dexilant, Prevacid, Nexium, Protonix,         Aciphex, Zegerid, Pantoprazole, Prilosec or omeprazole.  4) For 2 days prior to your test, do not take: Reglan, Tagamet, Zantac, Axid or Pepcid.  5) You MAY use an antacid such as Rolaids or Tums up to 12 hours prior to your test.  It will take at least 2 weeks to receive the results of this test from your physician. ------------------------------------------ ABOUT ESOPHAGEAL MANOMETRY Esophageal manometry (muh-NOM-uh-tree) is a test that gauges how well your esophagus works. Your esophagus is the long, muscular tube that connects your throat to your stomach. Esophageal manometry measures the rhythmic muscle contractions (peristalsis) that occur in your esophagus when you swallow. Esophageal manometry also measures the coordination and force exerted by the muscles of your esophagus.  During esophageal manometry, a thin, flexible tube (catheter) that contains sensors is passed through your nose, down your esophagus and into your stomach. Esophageal manometry can be helpful in diagnosing some mostly uncommon disorders that affect your esophagus.  Why it's done Esophageal manometry is used to evaluate the movement (motility) of food through the esophagus and into the stomach. The test measures how well the circular bands of muscle  (sphincters) at the top and bottom of your esophagus open and close, as well as the pressure, strength and pattern of the wave of esophageal muscle contractions that moves food along.  What you can expect Esophageal manometry is an outpatient procedure done without sedation. Most people tolerate it well. You may be asked to change into a hospital gown before the test starts.  During esophageal manometry  While you are sitting up, a member of your health care team sprays your throat with a numbing medication or puts numbing gel in your nose or both.  A catheter is guided through your nose into your esophagus. The catheter may be sheathed in a water-filled sleeve. It doesn't interfere with your breathing. However, your eyes may water, and you may gag. You may have a slight nosebleed from irritation.  After the catheter is in place, you may be asked to lie on your back on an exam table, or you may be asked to remain seated.  You then swallow small sips of water. As you do, a computer connected to the catheter records the pressure, strength and pattern of your esophageal muscle contractions.  During the test, you'll be asked to breathe slowly and smoothly, remain as still as possible, and swallow only when you're asked to do so.  A member of your health care team may move the catheter down into your stomach while the catheter continues its measurements.  The catheter then is slowly withdrawn. The test usually lasts 20 to 30 minutes.  After esophageal manometry  When your esophageal manometry is complete, you  may return to your normal activities  This test typically takes 30-45 minutes to complete.  We are sending in your prescriptions to your pharmacy ________________________________________________________________________________

## 2012-02-03 NOTE — Progress Notes (Signed)
History of Present Illness: This is a 57-year-old Caucasian female heavy smoker with COPD.  She has severe anxiety and depression and has repeatedly refused psychiatric evaluation.  She's currently on Medicaid ,and again wants to talk today about total disability.  She's had a complete recent GI evaluation including endoscopy with esophageal, gastric, and duodenal biopsiesm that were normal.  Colonoscopy also was unremarkable wirth colon biopsies.  She continues to complain of nausea, regurgitation, and burning substernal chest pain.  She cannot tolerate PPI therapy and says" these things do not help.".  She iwascheduled for esophageal manometry, she canceled her visit.  She recently was placed on dicyclomine but she has not taken this Rx, allegedly because of expense problems.  Review of her labs and x-ray shows no other abnormalities.    Current Medications, Allergies, Past Medical History, Past Surgical History, Family History and Social History were reviewed in [employer] record.   Assessment and plan: This patient does not need gastroenterology care at this point and can be followed by her primary care physician.  Her problems are 99% functional, and she needs psychiatric evaluation before any other medications.  I have reluctently   rescheduled her manometry, but I doubt she will keep this appointment.  Until she can reestablish with primary care, I placed her back on Zantac 300 mg twice a day and Benadryl 10 mg 3 times a day before meals.  This patient is very frustrating and difficult to deal with obviously.  Smoking cessation strongly recommended. Encounter Diagnosis  Name Primary?  Marland Kitchen Dysphagia Yes

## 2012-02-16 ENCOUNTER — Ambulatory Visit (HOSPITAL_COMMUNITY): Admission: RE | Admit: 2012-02-16 | Payer: Medicaid Other | Source: Ambulatory Visit | Admitting: Gastroenterology

## 2012-02-16 SURGERY — MANOMETRY, ESOPHAGUS

## 2013-10-18 ENCOUNTER — Encounter: Payer: Self-pay | Admitting: Gastroenterology

## 2014-05-24 ENCOUNTER — Telehealth: Payer: Self-pay | Admitting: Gastroenterology

## 2014-05-24 NOTE — Telephone Encounter (Signed)
Patient has moved away. She is asking when she had last colonoscopy/endoscopy and the results. Gave patient this information. Informed her she can send a ROI and her records would be faxed to her new MD.

## 2014-12-11 ENCOUNTER — Telehealth: Payer: Self-pay | Admitting: Gastroenterology

## 2014-12-11 NOTE — Telephone Encounter (Signed)
Patient states she needs her medical records for new MD in [place]. She will call them again. She wanted to know what medication she was on at her last visit. (Zantac) Information given to patient.

## 2017-10-25 ENCOUNTER — Inpatient Hospital Stay: Payer: Medicaid Other

## 2017-10-25 ENCOUNTER — Inpatient Hospital Stay
Admission: EM | Admit: 2017-10-25 | Discharge: 2017-10-26 | DRG: 281 | Discharge status: Against Medical Advice | Payer: Medicaid Other | Attending: Specialist | Admitting: Specialist

## 2017-10-25 ENCOUNTER — Encounter
Admission: EM | Discharge status: Against Medical Advice | Payer: Self-pay | Source: Home / Self Care | Attending: Specialist

## 2017-10-25 ENCOUNTER — Other Ambulatory Visit: Payer: Self-pay

## 2017-10-25 DIAGNOSIS — J432 Centrilobular emphysema: Secondary | ICD-10-CM | POA: Diagnosis not present

## 2017-10-25 DIAGNOSIS — I429 Cardiomyopathy, unspecified: Secondary | ICD-10-CM | POA: Diagnosis present

## 2017-10-25 DIAGNOSIS — I700 Atherosclerosis of aorta: Secondary | ICD-10-CM | POA: Diagnosis present

## 2017-10-25 DIAGNOSIS — R739 Hyperglycemia, unspecified: Secondary | ICD-10-CM | POA: Diagnosis present

## 2017-10-25 DIAGNOSIS — I219 Acute myocardial infarction, unspecified: Secondary | ICD-10-CM | POA: Diagnosis present

## 2017-10-25 DIAGNOSIS — M797 Fibromyalgia: Secondary | ICD-10-CM | POA: Diagnosis not present

## 2017-10-25 DIAGNOSIS — Z79899 Other long term (current) drug therapy: Secondary | ICD-10-CM

## 2017-10-25 DIAGNOSIS — J9811 Atelectasis: Secondary | ICD-10-CM | POA: Diagnosis present

## 2017-10-25 DIAGNOSIS — R079 Chest pain, unspecified: Secondary | ICD-10-CM

## 2017-10-25 DIAGNOSIS — Z91041 Radiographic dye allergy status: Secondary | ICD-10-CM

## 2017-10-25 DIAGNOSIS — E876 Hypokalemia: Secondary | ICD-10-CM | POA: Diagnosis present

## 2017-10-25 DIAGNOSIS — F17210 Nicotine dependence, cigarettes, uncomplicated: Secondary | ICD-10-CM | POA: Diagnosis present

## 2017-10-25 DIAGNOSIS — R609 Edema, unspecified: Secondary | ICD-10-CM | POA: Diagnosis present

## 2017-10-25 DIAGNOSIS — R05 Cough: Secondary | ICD-10-CM | POA: Diagnosis not present

## 2017-10-25 DIAGNOSIS — R9431 Abnormal electrocardiogram [ECG] [EKG]: Secondary | ICD-10-CM

## 2017-10-25 DIAGNOSIS — F410 Panic disorder [episodic paroxysmal anxiety] without agoraphobia: Secondary | ICD-10-CM | POA: Diagnosis present

## 2017-10-25 DIAGNOSIS — I5181 Takotsubo syndrome: Secondary | ICD-10-CM | POA: Diagnosis present

## 2017-10-25 DIAGNOSIS — I213 ST elevation (STEMI) myocardial infarction of unspecified site: Principal | ICD-10-CM | POA: Diagnosis present

## 2017-10-25 DIAGNOSIS — R011 Cardiac murmur, unspecified: Secondary | ICD-10-CM | POA: Diagnosis present

## 2017-10-25 DIAGNOSIS — I77811 Abdominal aortic ectasia: Secondary | ICD-10-CM | POA: Diagnosis not present

## 2017-10-25 DIAGNOSIS — F419 Anxiety disorder, unspecified: Secondary | ICD-10-CM | POA: Diagnosis not present

## 2017-10-25 DIAGNOSIS — I959 Hypotension, unspecified: Secondary | ICD-10-CM | POA: Diagnosis present

## 2017-10-25 DIAGNOSIS — R0789 Other chest pain: Secondary | ICD-10-CM | POA: Diagnosis not present

## 2017-10-25 HISTORY — PX: LEFT HEART CATH AND CORONARY ANGIOGRAPHY: CATH118249

## 2017-10-25 HISTORY — DX: Panic disorder (episodic paroxysmal anxiety): F41.0

## 2017-10-25 HISTORY — DX: Unspecified disorder of nose and nasal sinuses: J34.9

## 2017-10-25 HISTORY — PX: CORONARY/GRAFT ACUTE MI REVASCULARIZATION: CATH118305

## 2017-10-25 LAB — COMPREHENSIVE METABOLIC PANEL
ALBUMIN: 3.5 g/dL (ref 3.5–5.0)
ALK PHOS: 36 U/L — AB (ref 38–126)
ALT: 13 U/L (ref 0–44)
AST: 19 U/L (ref 15–41)
Anion gap: 12 (ref 5–15)
BILIRUBIN TOTAL: 0.7 mg/dL (ref 0.3–1.2)
BUN: 11 mg/dL (ref 6–20)
CALCIUM: 8.2 mg/dL — AB (ref 8.9–10.3)
CO2: 22 mmol/L (ref 22–32)
Chloride: 106 mmol/L (ref 98–111)
Creatinine, Ser: 0.68 mg/dL (ref 0.44–1.00)
GFR calc Af Amer: >60 mL/min (ref >60)
GFR calc non Af Amer: >60 mL/min (ref >60)
GLUCOSE: 165 mg/dL — AB (ref 70–99)
Potassium: 3.3 mmol/L — ABNORMAL LOW (ref 3.5–5.1)
Sodium: 140 mmol/L (ref 135–145)
Total Protein: 5.7 g/dL — ABNORMAL LOW (ref 6.5–8.1)

## 2017-10-25 LAB — URINALYSIS, ROUTINE W REFLEX MICROSCOPIC
Bilirubin Urine: NEGATIVE
GLUCOSE, UA: NEGATIVE mg/dL
HGB URINE DIPSTICK: NEGATIVE
Ketones, ur: NEGATIVE mg/dL
LEUKOCYTES UA: NEGATIVE
Nitrite: NEGATIVE
Protein, ur: NEGATIVE mg/dL
Specific Gravity, Urine: >1.046 — ABNORMAL HIGH (ref 1.005–1.030)
pH: 6 (ref 5.0–8.0)

## 2017-10-25 LAB — HEMOGLOBIN A1C
Hgb A1c MFr Bld: 5.9 % — ABNORMAL HIGH (ref 4.8–5.6)
Mean Plasma Glucose: 122.63 mg/dL

## 2017-10-25 LAB — CARDIAC CATHETERIZATION: Cath EF Quantitative: 25 %

## 2017-10-25 LAB — CBC
HEMATOCRIT: 40.8 % (ref 35.0–47.0)
Hemoglobin: 13.4 g/dL (ref 12.0–16.0)
MCH: 31.2 pg (ref 26.0–34.0)
MCHC: 32.9 g/dL (ref 32.0–36.0)
MCV: 94.9 fL (ref 80.0–100.0)
PLATELETS: 353 10*3/uL (ref 150–440)
RBC: 4.3 MIL/uL (ref 3.80–5.20)
RDW: 13.4 % (ref 11.5–14.5)
WBC: 21 10*3/uL — ABNORMAL HIGH (ref 3.6–11.0)

## 2017-10-25 LAB — GLUCOSE, CAPILLARY: Glucose-Capillary: 143 mg/dL — ABNORMAL HIGH (ref 70–99)

## 2017-10-25 LAB — TROPONIN I
Troponin I: 1.05 ng/mL (ref <0.03)
Troponin I: 2.69 ng/mL (ref <0.03)

## 2017-10-25 LAB — FIBRIN DERIVATIVES D-DIMER (ARMC ONLY): Fibrin derivatives D-dimer (ARMC): 353.73 ng/mL (FEU) (ref 0.00–499.00)

## 2017-10-25 LAB — TSH: TSH: 2.701 u[IU]/mL (ref 0.350–4.500)

## 2017-10-25 LAB — MRSA PCR SCREENING: MRSA by PCR: NEGATIVE

## 2017-10-25 SURGERY — CORONARY/GRAFT ACUTE MI REVASCULARIZATION
Anesthesia: Moderate Sedation

## 2017-10-25 MED ORDER — MORPHINE SULFATE (PF) 4 MG/ML IV SOLN
INTRAVENOUS | Status: AC
  Administered 2017-10-25: 4 mg
  Filled 2017-10-25: qty 1

## 2017-10-25 MED ORDER — ACETAMINOPHEN 650 MG RE SUPP: 650.0000 mg | Freq: Four times a day (QID) | RECTAL | Status: DC | PRN

## 2017-10-25 MED ORDER — DIPHENHYDRAMINE HCL 50 MG/ML IJ SOLN
INTRAMUSCULAR | Status: DC | PRN
  Administered 2017-10-25: 25 mg via INTRAVENOUS

## 2017-10-25 MED ORDER — METHYLPREDNISOLONE SODIUM SUCC 125 MG IJ SOLR
INTRAMUSCULAR | Status: AC
  Filled 2017-10-25: qty 2

## 2017-10-25 MED ORDER — ONDANSETRON HCL 4 MG/2ML IJ SOLN
INTRAMUSCULAR | Status: AC
  Administered 2017-10-25: 4 mg via INTRAVENOUS
  Filled 2017-10-25: qty 2

## 2017-10-25 MED ORDER — MONTELUKAST SODIUM 10 MG PO TABS
10.0000 mg | ORAL_TABLET | Freq: Every day | ORAL | Status: DC
  Administered 2017-10-25: 10 mg via ORAL
  Filled 2017-10-25: qty 1

## 2017-10-25 MED ORDER — SODIUM CHLORIDE 0.9 % IV BOLUS
1000.0000 mL | Freq: Once | INTRAVENOUS | Status: AC
  Administered 2017-10-25: 1000 mL via INTRAVENOUS

## 2017-10-25 MED ORDER — ONDANSETRON HCL 4 MG/2ML IJ SOLN: 4.0000 mg | Freq: Four times a day (QID) | INTRAMUSCULAR | Status: DC | PRN

## 2017-10-25 MED ORDER — PNEUMOCOCCAL VAC POLYVALENT 25 MCG/0.5ML IJ INJ: 0.5000 mL | INJECTION | INTRAMUSCULAR | Status: DC

## 2017-10-25 MED ORDER — MAGNESIUM HYDROXIDE 400 MG/5ML PO SUSP
30.0000 mL | Freq: Every evening | ORAL | Status: DC | PRN
  Administered 2017-10-25: 30 mL via ORAL
  Filled 2017-10-25: qty 30

## 2017-10-25 MED ORDER — LORAZEPAM 2 MG/ML IJ SOLN
2.0000 mg | Freq: Four times a day (QID) | INTRAMUSCULAR | Status: DC | PRN
  Administered 2017-10-25 (×2): 2 mg via INTRAVENOUS
  Filled 2017-10-25 (×2): qty 1

## 2017-10-25 MED ORDER — AZELASTINE HCL 0.1 % NA SOLN
2.0000 | Freq: Two times a day (BID) | NASAL | Status: DC
  Administered 2017-10-25: 2 via NASAL
  Filled 2017-10-25: qty 30

## 2017-10-25 MED ORDER — ALPRAZOLAM 0.5 MG PO TABS
0.5000 mg | ORAL_TABLET | Freq: Every day | ORAL | Status: DC
  Administered 2017-10-25 – 2017-10-26 (×2): 0.5 mg via ORAL
  Filled 2017-10-25 (×2): qty 1

## 2017-10-25 MED ORDER — MIDAZOLAM HCL 2 MG/2ML IJ SOLN
INTRAMUSCULAR | Status: DC | PRN
  Administered 2017-10-25: 1 mg via INTRAVENOUS

## 2017-10-25 MED ORDER — SODIUM CHLORIDE 0.9 % IV SOLN
1.0000 g | Freq: Three times a day (TID) | INTRAVENOUS | Status: DC
  Administered 2017-10-25 – 2017-10-26 (×3): 1 g via INTRAVENOUS
  Filled 2017-10-25 (×6): qty 1

## 2017-10-25 MED ORDER — SODIUM CHLORIDE 0.9 % WEIGHT BASED INFUSION
1.0000 mL/kg/h | INTRAVENOUS | Status: AC
  Administered 2017-10-25: 1 mL/kg/h via INTRAVENOUS

## 2017-10-25 MED ORDER — SODIUM CHLORIDE 0.9% FLUSH: 3.0000 mL | INTRAVENOUS | Status: DC | PRN

## 2017-10-25 MED ORDER — ASPIRIN EC 325 MG PO TBEC
325.0000 mg | DELAYED_RELEASE_TABLET | Freq: Every day | ORAL | Status: DC
  Administered 2017-10-25: 325 mg via ORAL
  Filled 2017-10-25 (×2): qty 1

## 2017-10-25 MED ORDER — MORPHINE SULFATE (PF) 2 MG/ML IV SOLN: 2.0000 mg | INTRAVENOUS | Status: DC | PRN

## 2017-10-25 MED ORDER — ACETAMINOPHEN 325 MG PO TABS: 650.0000 mg | ORAL_TABLET | Freq: Four times a day (QID) | ORAL | Status: DC | PRN

## 2017-10-25 MED ORDER — HEPARIN (PORCINE) IN NACL 1000-0.9 UT/500ML-% IV SOLN
INTRAVENOUS | Status: DC | PRN
  Administered 2017-10-25: 1000 mL

## 2017-10-25 MED ORDER — IOPAMIDOL (ISOVUE-370) INJECTION 76%
100.0000 mL | Freq: Once | INTRAVENOUS | Status: AC | PRN
  Administered 2017-10-25: 100 mL via INTRAVENOUS

## 2017-10-25 MED ORDER — ONDANSETRON HCL 4 MG PO TABS: 4.0000 mg | ORAL_TABLET | Freq: Four times a day (QID) | ORAL | Status: DC | PRN

## 2017-10-25 MED ORDER — HEPARIN SODIUM (PORCINE) 5000 UNIT/ML IJ SOLN
4000.0000 [IU] | Freq: Once | INTRAMUSCULAR | Status: AC
  Administered 2017-10-25: 4000 [IU] via INTRAVENOUS

## 2017-10-25 MED ORDER — FAMOTIDINE IN NACL 20-0.9 MG/50ML-% IV SOLN
INTRAVENOUS | Status: AC | PRN
  Administered 2017-10-25: 20 mg via INTRAVENOUS

## 2017-10-25 MED ORDER — ONDANSETRON HCL 4 MG/2ML IJ SOLN
4.0000 mg | Freq: Four times a day (QID) | INTRAMUSCULAR | Status: DC | PRN
  Administered 2017-10-25: 4 mg via INTRAVENOUS

## 2017-10-25 MED ORDER — ENOXAPARIN SODIUM 40 MG/0.4ML ~~LOC~~ SOLN
40.0000 mg | SUBCUTANEOUS | Status: DC
  Filled 2017-10-25: qty 0.4

## 2017-10-25 MED ORDER — ONDANSETRON HCL 4 MG/2ML IJ SOLN
INTRAMUSCULAR | Status: AC
  Filled 2017-10-25: qty 2

## 2017-10-25 MED ORDER — HYDROCORTISONE NA SUCCINATE PF 250 MG IJ SOLR
200.0000 mg | Freq: Once | INTRAMUSCULAR | Status: DC
  Filled 2017-10-25: qty 200

## 2017-10-25 MED ORDER — MIDAZOLAM HCL 2 MG/2ML IJ SOLN
INTRAMUSCULAR | Status: AC
  Filled 2017-10-25: qty 2

## 2017-10-25 MED ORDER — FENTANYL CITRATE (PF) 100 MCG/2ML IJ SOLN
INTRAMUSCULAR | Status: AC
  Filled 2017-10-25: qty 2

## 2017-10-25 MED ORDER — DIPHENHYDRAMINE HCL 50 MG/ML IJ SOLN
INTRAMUSCULAR | Status: AC
  Filled 2017-10-25: qty 1

## 2017-10-25 MED ORDER — DIPHENHYDRAMINE HCL 50 MG PO CAPS
50.0000 mg | ORAL_CAPSULE | Freq: Once | ORAL | Status: DC
  Filled 2017-10-25: qty 1

## 2017-10-25 MED ORDER — NOREPINEPHRINE BITARTRATE 1 MG/ML IV SOLN
INTRAVENOUS | Status: AC
  Filled 2017-10-25: qty 4

## 2017-10-25 MED ORDER — SODIUM CHLORIDE 0.9 % IV SOLN: 250.0000 mL | INTRAVENOUS | Status: DC | PRN

## 2017-10-25 MED ORDER — IOPAMIDOL (ISOVUE-300) INJECTION 61%
INTRAVENOUS | Status: DC | PRN
  Administered 2017-10-25: 120 mL via INTRA_ARTERIAL

## 2017-10-25 MED ORDER — ACETAMINOPHEN 325 MG PO TABS: 650.0000 mg | ORAL_TABLET | ORAL | Status: DC | PRN

## 2017-10-25 MED ORDER — METHYLPREDNISOLONE SODIUM SUCC 125 MG IJ SOLR
INTRAMUSCULAR | Status: DC | PRN
  Administered 2017-10-25: 125 mg via INTRAVENOUS

## 2017-10-25 MED ORDER — FENTANYL CITRATE (PF) 100 MCG/2ML IJ SOLN
INTRAMUSCULAR | Status: DC | PRN
  Administered 2017-10-25: 25 ug via INTRAVENOUS

## 2017-10-25 MED ORDER — ONDANSETRON HCL 4 MG/2ML IJ SOLN
INTRAMUSCULAR | Status: DC | PRN
  Administered 2017-10-25: 4 mg via INTRAVENOUS

## 2017-10-25 MED ORDER — TICAGRELOR 90 MG PO TABS
180.0000 mg | ORAL_TABLET | Freq: Once | ORAL | Status: AC
  Administered 2017-10-25: 180 mg via ORAL
  Filled 2017-10-25: qty 2

## 2017-10-25 MED ORDER — NOREPINEPHRINE 4 MG/250ML-% IV SOLN
0.0000 ug/min | INTRAVENOUS | Status: DC
  Administered 2017-10-25: 5 ug/min via INTRAVENOUS
  Filled 2017-10-25: qty 250

## 2017-10-25 MED ORDER — SODIUM CHLORIDE 0.9% FLUSH
3.0000 mL | Freq: Two times a day (BID) | INTRAVENOUS | Status: DC
  Administered 2017-10-25: 3 mL via INTRAVENOUS

## 2017-10-25 MED ORDER — VENLAFAXINE HCL 37.5 MG PO TABS
37.5000 mg | ORAL_TABLET | Freq: Every day | ORAL | Status: DC
  Filled 2017-10-25 (×2): qty 1

## 2017-10-25 MED ORDER — DIPHENHYDRAMINE HCL 50 MG/ML IJ SOLN: 50.0000 mg | Freq: Once | INTRAMUSCULAR | Status: DC

## 2017-10-25 SURGICAL SUPPLY — 13 items
CATH INFINITI 5FR ANG PIGTAIL (CATHETERS) ×3 IMPLANT
CATH INFINITI JR4 5F (CATHETERS) ×3 IMPLANT
CATH VISTA GUIDE 6FR XB3.5 SH (CATHETERS) ×3 IMPLANT
DEVICE CLOSURE MYNXGRIP 6/7F (Vascular Products) ×3 IMPLANT
DEVICE INFLAT 30 PLUS (MISCELLANEOUS) ×3 IMPLANT
KIT MANI 3VAL PERCEP (MISCELLANEOUS) ×3 IMPLANT
NEEDLE PERC 18GX7CM (NEEDLE) ×3 IMPLANT
PACK CARDIAC CATH (CUSTOM PROCEDURE TRAY) ×3 IMPLANT
PROTECTION STATION PRESSURIZED (MISCELLANEOUS) ×3
SHEATH AVANTI 6FR X 11CM (SHEATH) ×3 IMPLANT
STATION PROTECTION PRESSURIZED (MISCELLANEOUS) ×1 IMPLANT
SYR CONTROL 10ML ANGIOGRAPHIC (SYRINGE) ×3 IMPLANT
WIRE GUIDERIGHT .035X150 (WIRE) ×3 IMPLANT
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
## 2017-10-25 NOTE — ED Triage Notes (Signed)
Pt arrived by aems for chest pain 10/10 starting at 1030 today. Ems gave 4mg  zofran, 1 dose nitroglycerin, 1 dose aspirin, and 50 mcg of fentanyl to no relief of pain and nausea. BP started at 180/100 and dropped after fentanyl. NS initiated and Bp brought up to "80's/50's". EKG shows ST elevation in 4,5, and 6. Hx of heart murmur
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
## 2017-10-25 NOTE — Progress Notes (Signed)
   10/25/17 1117  Clinical Encounter Type  Visited With Patient;Health care provider  Visit Type Code;Initial  Spiritual Encounters  Spiritual Needs Emotional;Prayer   Chaplain responded to code STEMI.  Upon patient's arrival, chaplain maintained calming presence, offering silent and energetic prayers for patient and care team.  Per EMS worker, people may be arriving to support patient.  Chaplain introduced self to patient, utilized focus on breath and energetic prayer as patient dealt with pain.  After patient left for the cath lab, chaplain sought family/friends in the lobby; however, no one had arrived. Chaplain updated nurse and encouraged her to page chaplain if support needed.

## 2017-10-25 NOTE — Progress Notes (Signed)
   10/25/17 1920  Clinical Encounter Type  Visited With Patient not available;Health care provider  Visit Type Follow-up  Spiritual Encounters  Spiritual Needs Prayer  Stress Factors  Patient Stress Factors Family relationships  Family Stress Factors Family relationships   Chaplain attempted follow up visit; patient sleeping, chaplain offered silent and energetic prayers in doorway.  Chaplain spoke with staff about earlier encounter with patient in ED and learned of family dynamics since patient's arrival on unit.  Chaplain encouraged staff to reach out to chaplain as needed.

## 2017-10-25 NOTE — ED Notes (Signed)
Pt taken to cath lab

## 2017-10-25 NOTE — H&P (Addendum)
[place] [facility] [place] NAME: Bianca Reilly    MR#:  [id number]  DATE OF BIRTH:  1955-08-06  DATE OF ADMISSION:  10/25/2017  PRIMARY CARE PHYSICIAN: No primary care provider on file.   REQUESTING/REFERRING PHYSICIAN: Dr. Nance Pear  CHIEF COMPLAINT:  No chief complaint on file.   HISTORY OF PRESENT ILLNESS:  Bianca Reilly  is a 62 y.o. female with a known history of anxiety, panic attacks, fibromyalgia and recent diagnosis of bronchitis presents to hospital secondary to worsening chest pain that started this morning.  Patient is in severe chest pain, unable to provide any history at this time.  Apparently she is out of state and is visiting her daughter.  No previous history is available.  No known cardiac history.  She had lateral wall ST elevations on presentation.  Code STEMI was called and had a cardiac catheterization done.  Has clean coronaries but has Takotsubo cardiomyopathy with apical wall hypokinesis and EF is 25%. -She was hypertensive initially and then after fentanyl for chest pain, dropped her blood pressure and is currently on Levophed drip.  Still complains of 10 on 10 chest pain radiating to her back.  Also complains of nausea and diaphoresis.  PAST MEDICAL HISTORY:   Past Medical History:  Diagnosis Date  . Anxiety   . Fibromyalgia   . Panic attacks   . Sinus problem     PAST SURGICAL HISTORY:   Past Surgical History:  Procedure Laterality Date  . ABDOMINAL HYSTERECTOMY      SOCIAL HISTORY:   Social History   Tobacco Use  . Smoking status: Current Every Day Smoker    Packs/day: 1.00  . Smokeless tobacco: Never Used  Substance Use Topics  . Alcohol use: Never    Frequency: Never    FAMILY HISTORY:  No family history on file.  DRUG ALLERGIES:   Allergies  Allergen Reactions  . Ivp Dye [Iodinated Diagnostic Agents]     REVIEW OF SYSTEMS:   Review of Systems  Constitutional: Negative for  chills, fever, malaise/fatigue and weight loss.  HENT: Negative for ear discharge, ear pain, hearing loss and nosebleeds.   Eyes: Negative for blurred vision, double vision and photophobia.  Respiratory: Positive for shortness of breath. Negative for cough, hemoptysis and wheezing.   Cardiovascular: Positive for chest pain. Negative for palpitations, orthopnea and leg swelling.  Gastrointestinal: Negative for abdominal pain, constipation, diarrhea, heartburn, melena, nausea and vomiting.  Genitourinary: Negative for dysuria, frequency, hematuria and urgency.  Musculoskeletal: Negative for back pain, myalgias and neck pain.  Skin: Negative for rash.  Neurological: Negative for dizziness, tingling, tremors, sensory change, speech change, focal weakness and headaches.  Endo/Heme/Allergies: Does not bruise/bleed easily.  Psychiatric/Behavioral: Negative for depression. The patient is nervous/anxious.     MEDICATIONS AT HOME:   Prior to Admission medications   Medication Sig Start Date End Date Taking? Authorizing Provider  albuterol (PROVENTIL HFA;VENTOLIN HFA) 108 (90 Base) MCG/ACT inhaler Inhale 1-2 puffs into the lungs every 6 (six) hours as needed for wheezing or shortness of breath.   Yes [provider]  ALPRAZolam Duanne Moron) 0.5 MG tablet Take 0.5 mg by mouth daily.   Yes [provider]  azelastine (ASTELIN) 0.1 % nasal spray Place 2 sprays into both nostrils 2 (two) times daily. Use in each nostril as directed   Yes [provider]  levofloxacin (LEVAQUIN) 500 MG tablet Take 500 mg by mouth daily. For 10 days  Yes [provider]  montelukast (SINGULAIR) 10 MG tablet Take 10 mg by mouth at bedtime.   Yes [provider]  venlafaxine (EFFEXOR) 37.5 MG tablet Take 37.5 mg by mouth daily.   Yes [provider]      VITAL SIGNS:  Blood pressure (!) 86/49, pulse 65, resp. rate 14, height 5\' 4"  (1.626 m), weight 54.4 kg, SpO2 (!) 88  %.  PHYSICAL EXAMINATION:   Physical Exam  GENERAL:  62 y.o.-year-old patient lying in the bed, appears in significant distress due to chest pain EYES: Pupils equal, round, reactive to light and accommodation. No scleral icterus. Extraocular muscles intact.  HEENT: Head atraumatic, normocephalic. Oropharynx and nasopharynx clear.  NECK:  Supple, no jugular venous distention. No thyroid enlargement, no tenderness.  LUNGS: Normal breath sounds bilaterally, no wheezing, rales,rhonchi or crepitation. No use of accessory muscles of respiration.  CARDIOVASCULAR: S1, S2 normal. No murmurs, rubs, or gallops. No chest wall  tenderness ABDOMEN: Soft, nontender, nondistended. Bowel sounds present. No organomegaly or mass.  EXTREMITIES: No pedal edema, cyanosis, or clubbing.  NEUROLOGIC: Cranial nerves II through XII are intact. Muscle strength 5/5 in all extremities. Sensation intact. Gait not checked.  PSYCHIATRIC: The patient is alert and oriented x 3.  SKIN: No obvious rash, lesion, or ulcer.   LABORATORY PANEL:   CBC No results for input(s): WBC, HGB, HCT, PLT in the last 168 hours. ------------------------------------------------------------------------------------------------------------------  Chemistries  No results for input(s): NA, K, CL, CO2, GLUCOSE, BUN, CREATININE, CALCIUM, MG, AST, ALT, ALKPHOS, BILITOT in the last 168 hours.  Invalid input(s): GFRCGP ------------------------------------------------------------------------------------------------------------------  Cardiac Enzymes No results for input(s): TROPONINI in the last 168 hours. ------------------------------------------------------------------------------------------------------------------  RADIOLOGY:  No results found.  EKG:   Orders placed or performed during the hospital encounter of 10/25/17  . EKG 12-Lead  . EKG 12-Lead  . EKG 12-Lead  . EKG 12-Lead    IMPRESSION AND PLAN:   Bianca Reilly  is a 62  y.o. female with a known history of anxiety, panic attacks, fibromyalgia and recent diagnosis of bronchitis presents to hospital secondary to worsening chest pain that started this morning  1. Chest pain-initially admitted as STEMI due to ST elevations. -Status post cardiac catheterization with normal coronaries.  However has apical hypokinesis likely targetable cardiomyopathy. -Admit to ICU for Levophed drip -EF is 20 to 25%, will avoid extensive IV fluids -Will need beta-blocker, ACEI/ARB, ?lasix- once her BP improves - for now she is on levophed drip for hypotension -Still has significant chest pain.  Will do a CT chest to rule out PE or aortic dissection  2.  Hypotension-no known history of hypertension.  Blood pressure dropped after pain medications in ED. -Continue Levophed drip for now -Echocardiogram ordered  3.  Anxiety and panic attacks-Ativan PRN.  Patient also on Effexor  4.  Acute bronchitis-follow-up chest x-ray.  Follow-up labs and vitals.  Hold off on antibiotics for now.  5.  DVT prophylaxis-Lovenox  6.  Tobacco use disorder-counseled, nicotine patch   All the records are reviewed and case discussed with ED provider. Management plans discussed with the patient, family and they are in agreement.  CODE STATUS: Full Code  TOTAL CRITICAL CARE TIME SPENT IN TAKING CARE OF THIS PATIENT: 55 minutes.    Hurley Sobel M.D on 10/25/2017 at 1:22 PM  Between 7am to 6pm - Pager - [phone]  After 6pm go to [email or web address] - password [email or web address] [facility]  Office  [phone]  CC: Primary care  physician; No primary care provider on file.

## 2017-10-25 NOTE — ED Notes (Signed)
Cardiologist now at bedside.
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
## 2017-10-26 ENCOUNTER — Other Ambulatory Visit: Payer: Self-pay

## 2017-10-26 ENCOUNTER — Encounter: Payer: Self-pay | Admitting: Internal Medicine

## 2017-10-26 ENCOUNTER — Observation Stay
Admission: EM | Admit: 2017-10-26 | Discharge: 2017-10-27 | Disposition: A | Discharge status: Home / Self Care | Payer: Medicaid Other | Attending: Internal Medicine | Admitting: Internal Medicine

## 2017-10-26 ENCOUNTER — Emergency Department: Payer: Medicaid Other

## 2017-10-26 DIAGNOSIS — R05 Cough: Secondary | ICD-10-CM | POA: Insufficient documentation

## 2017-10-26 DIAGNOSIS — J432 Centrilobular emphysema: Secondary | ICD-10-CM | POA: Insufficient documentation

## 2017-10-26 DIAGNOSIS — Z91041 Radiographic dye allergy status: Secondary | ICD-10-CM | POA: Insufficient documentation

## 2017-10-26 DIAGNOSIS — M797 Fibromyalgia: Secondary | ICD-10-CM | POA: Insufficient documentation

## 2017-10-26 DIAGNOSIS — F17210 Nicotine dependence, cigarettes, uncomplicated: Secondary | ICD-10-CM | POA: Insufficient documentation

## 2017-10-26 DIAGNOSIS — I219 Acute myocardial infarction, unspecified: Secondary | ICD-10-CM

## 2017-10-26 DIAGNOSIS — R0789 Other chest pain: Secondary | ICD-10-CM | POA: Diagnosis not present

## 2017-10-26 DIAGNOSIS — Z79899 Other long term (current) drug therapy: Secondary | ICD-10-CM | POA: Insufficient documentation

## 2017-10-26 DIAGNOSIS — I77811 Abdominal aortic ectasia: Secondary | ICD-10-CM | POA: Insufficient documentation

## 2017-10-26 DIAGNOSIS — I700 Atherosclerosis of aorta: Secondary | ICD-10-CM | POA: Insufficient documentation

## 2017-10-26 DIAGNOSIS — F419 Anxiety disorder, unspecified: Secondary | ICD-10-CM | POA: Diagnosis present

## 2017-10-26 DIAGNOSIS — R079 Chest pain, unspecified: Secondary | ICD-10-CM | POA: Diagnosis present

## 2017-10-26 DIAGNOSIS — I429 Cardiomyopathy, unspecified: Secondary | ICD-10-CM

## 2017-10-26 DIAGNOSIS — F410 Panic disorder [episodic paroxysmal anxiety] without agoraphobia: Secondary | ICD-10-CM | POA: Insufficient documentation

## 2017-10-26 LAB — LIPID PANEL
CHOL/HDL RATIO: 2.4 ratio
CHOLESTEROL: 169 mg/dL (ref 0–200)
HDL: 71 mg/dL (ref >40)
LDL Cholesterol: 84 mg/dL (ref 0–99)
Triglycerides: 72 mg/dL (ref <150)
VLDL: 14 mg/dL (ref 0–40)

## 2017-10-26 LAB — TROPONIN I
TROPONIN I: 3.12 ng/mL — AB (ref <0.03)
TROPONIN I: 1.76 ng/mL — AB (ref <0.03)

## 2017-10-26 LAB — CBC
HCT: 39.1 % (ref 35.0–47.0)
HCT: 43.4 % (ref 35.0–47.0)
Hemoglobin: 13.1 g/dL (ref 12.0–16.0)
Hemoglobin: 14.4 g/dL (ref 12.0–16.0)
MCH: 31.5 pg (ref 26.0–34.0)
MCH: 31.5 pg (ref 26.0–34.0)
MCHC: 33.4 g/dL (ref 32.0–36.0)
MCHC: 33.1 g/dL (ref 32.0–36.0)
MCV: 94.2 fL (ref 80.0–100.0)
MCV: 95.1 fL (ref 80.0–100.0)
PLATELETS: 259 10*3/uL (ref 150–440)
PLATELETS: 277 10*3/uL (ref 150–440)
RBC: 4.15 MIL/uL (ref 3.80–5.20)
RBC: 4.56 MIL/uL (ref 3.80–5.20)
RDW: 12.9 % (ref 11.5–14.5)
RDW: 12.8 % (ref 11.5–14.5)
WBC: 10.7 10*3/uL (ref 3.6–11.0)
WBC: 15.2 10*3/uL — AB (ref 3.6–11.0)

## 2017-10-26 LAB — BASIC METABOLIC PANEL
Anion gap: 7 (ref 5–15)
Anion gap: 9 (ref 5–15)
BUN: 12 mg/dL (ref 6–20)
BUN: 12 mg/dL (ref 6–20)
CHLORIDE: 108 mmol/L (ref 98–111)
CHLORIDE: 105 mmol/L (ref 98–111)
CO2: 23 mmol/L (ref 22–32)
CO2: 25 mmol/L (ref 22–32)
CREATININE: 0.62 mg/dL (ref 0.44–1.00)
Calcium: 8.6 mg/dL — ABNORMAL LOW (ref 8.9–10.3)
Calcium: 8.9 mg/dL (ref 8.9–10.3)
Creatinine, Ser: 0.59 mg/dL (ref 0.44–1.00)
GFR calc Af Amer: >60 mL/min (ref >60)
GFR calc non Af Amer: >60 mL/min (ref >60)
GFR calc non Af Amer: >60 mL/min (ref >60)
Glucose, Bld: 152 mg/dL — ABNORMAL HIGH (ref 70–99)
Glucose, Bld: 127 mg/dL — ABNORMAL HIGH (ref 70–99)
POTASSIUM: 3.7 mmol/L (ref 3.5–5.1)
Potassium: 4.2 mmol/L (ref 3.5–5.1)
SODIUM: 138 mmol/L (ref 135–145)
SODIUM: 139 mmol/L (ref 135–145)

## 2017-10-26 MED ORDER — ACETAMINOPHEN 650 MG RE SUPP: 650.0000 mg | Freq: Four times a day (QID) | RECTAL | Status: DC | PRN

## 2017-10-26 MED ORDER — ONDANSETRON HCL 4 MG PO TABS: 4.0000 mg | ORAL_TABLET | Freq: Four times a day (QID) | ORAL | Status: DC | PRN

## 2017-10-26 MED ORDER — ALPRAZOLAM 0.5 MG PO TABS: 0.5000 mg | ORAL_TABLET | Freq: Every day | ORAL | Status: DC

## 2017-10-26 MED ORDER — VENLAFAXINE HCL 37.5 MG PO TABS
37.5000 mg | ORAL_TABLET | Freq: Every day | ORAL | Status: DC
  Filled 2017-10-26: qty 1

## 2017-10-26 MED ORDER — MONTELUKAST SODIUM 10 MG PO TABS: 10.0000 mg | ORAL_TABLET | Freq: Every day | ORAL | Status: DC

## 2017-10-26 MED ORDER — ACETAMINOPHEN 325 MG PO TABS: 650.0000 mg | ORAL_TABLET | Freq: Four times a day (QID) | ORAL | Status: DC | PRN

## 2017-10-26 MED ORDER — ENOXAPARIN SODIUM 40 MG/0.4ML ~~LOC~~ SOLN
40.0000 mg | SUBCUTANEOUS | Status: DC
  Administered 2017-10-27: 40 mg via SUBCUTANEOUS
  Filled 2017-10-26: qty 0.4

## 2017-10-26 MED ORDER — ONDANSETRON HCL 4 MG/2ML IJ SOLN: 4.0000 mg | Freq: Four times a day (QID) | INTRAMUSCULAR | Status: DC | PRN

## 2017-10-26 NOTE — ED Notes (Signed)
Report received, care assumed.
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
## 2017-10-26 NOTE — Progress Notes (Signed)
Follow up - Critical Care Medicine Note  Patient Details:    Bianca Reilly is an 62 y.o. female.with a past medical history of fibromyalgia, chronic sinusitis, anxiety/panic disorder presented with acute onset of chest pain after having an argument with her husband.  She states that the pain was severe, anterior chest wall, associated with diaphoresis, nausea, pain going to her back, brought to the emergency department and she was noted to have lateral wall ST segment changes, taken for cardiac catheterization and was shown to have cardiomyopathy with apical wall hypokinesis, ejection fraction of 25% consistent with stress-induced cardiomyopathy Takotsubo.  He was brought to the intensive care unit for further evaluation and management.  Of note patient states that she was recently diagnosed with immune deficiency, had difficulty with obtaining IVIG secondary to social issues, has recently had difficulty with thrush  Lines, Airways, Drains:    Anti-infectives:  Anti-infectives (From admission, onward)   Start     Dose/Rate Route Frequency Ordered Stop   10/25/17 1700  ceFEPIme (MAXIPIME) 1 g in sodium chloride 0.9 % 100 mL IVPB     1 g 200 mL/hr over 30 Minutes Intravenous Every 8 hours 10/25/17 1538        Microbiology: Results for orders placed or performed during the hospital encounter of 10/25/17  MRSA PCR Screening     Status: None   Collection Time: 10/25/17  2:07 PM  Result Value Ref Range Status   MRSA by PCR NEGATIVE NEGATIVE Final    Comment:        The GeneXpert MRSA Assay (FDA approved for NASAL specimens only), is one component of a comprehensive MRSA colonization surveillance program. It is not intended to diagnose MRSA infection nor to guide or monitor treatment for MRSA infections. Performed at [facility], [address] [place], [place] [address]   CULTURE, BLOOD (ROUTINE X 2) w Reflex to ID Panel     Status: None (Preliminary result)   Collection  Time: 10/25/17  4:51 PM  Result Value Ref Range Status   Specimen Description BLOOD LAC  Final   Special Requests   Final    BOTTLES DRAWN AEROBIC AND ANAEROBIC Blood Culture results may not be optimal due to an excessive volume of blood received in culture bottles   Culture   Final    NO GROWTH < 24 HOURS Performed at [facility], [address] [place], [place] [address]    Report Status PENDING  Incomplete  CULTURE, BLOOD (ROUTINE X 2) w Reflex to ID Panel     Status: None (Preliminary result)   Collection Time: 10/25/17  4:53 PM  Result Value Ref Range Status   Specimen Description BLOOD LFOA  Final   Special Requests   Final    BOTTLES DRAWN AEROBIC AND ANAEROBIC Blood Culture results may not be optimal due to an excessive volume of blood received in culture bottles   Culture   Final    NO GROWTH < 24 HOURS Performed at [facility], [address] [place], [place] [address]    Report Status PENDING  Incomplete    Studies: Ct Angio Chest/abd/pel For Dissection W And/or W/wo  Result Date: 10/25/2017 CLINICAL DATA:  62 year old female with persistent chest pain and negative cardiac catheterization. EXAM: CT ANGIOGRAPHY CHEST, ABDOMEN AND PELVIS TECHNIQUE: Multidetector CT imaging through the chest, abdomen and pelvis was performed using the standard protocol during bolus administration of intravenous contrast. Multiplanar reconstructed images and MIPs were obtained  and reviewed to evaluate the vascular anatomy. Patient was premedicated prior to study and had no immediate adverse effects post IV contrast. CONTRAST:  172mL ISOVUE-370 IOPAMIDOL (ISOVUE-370) INJECTION 76% COMPARISON:  None. FINDINGS: CTA CHEST FINDINGS Cardiovascular: Noncontrast images demonstrate no evidence of acute mural hematoma within the aorta. Aortic atherosclerosis is noted without aneurysmal dilatation. No dissection is noted allowing for motion related artifacts. Pulmonary vasculature is  unremarkable. Heart size is normal. No pericardial effusion or thickening. No apparent calcific coronary arteriosclerosis. Mediastinum/Nodes: No enlarged mediastinal, hilar, or axillary lymph nodes. Thyroid gland, trachea, and esophagus demonstrate no significant findings. Lungs/Pleura: Mild interstitial edema with mild peribronchial thickening bilaterally. Centrilobular emphysema, upper lobe predominant. Bilateral dependent atelectasis. No pneumothorax. Musculoskeletal: No chest wall abnormality. No acute or significant osseous findings. Review of the MIP images confirms the above findings. CTA ABDOMEN AND PELVIS FINDINGS VASCULAR Aorta: Mild atherosclerosis of the abdominal aorta without aneurysmal dilatation. Ectasia of the distal abdominal aorta just proximal to the bifurcation measuring up to 2.8 cm transverse. Celiac: Patent without evidence of aneurysm, dissection, vasculitis or significant stenosis. SMA: Patent without evidence of aneurysm, dissection, vasculitis or significant stenosis. Renals: Two renal arteries to the right kidney and one noted to the left without significant stenosis. No evidence of aneurysm, fibromuscular dysplasia or dissection. IMA: Patent without evidence of aneurysm, dissection, vasculitis or significant stenosis. Inflow: Patent without evidence of aneurysm, dissection, vasculitis or significant stenosis. Veins: No obvious venous abnormality within the limitations of this arterial phase study. Review of the MIP images confirms the above findings. NON-VASCULAR Hepatobiliary: No focal liver abnormality is seen. No gallstones, gallbladder wall thickening, or biliary dilatation. Pancreas: Unremarkable. No pancreatic ductal dilatation or surrounding inflammatory changes. Spleen: Normal size spleen without mass. Adrenals/Urinary Tract: Hyperdensities noted on the unenhanced images within the left renal collecting system are felt secondary to test injection of contrast. No definite renal  or ureteral stones. Tiny hyperdensity seen on the unenhanced study in the interpolar aspect of the left kidney measuring approximately 2 mm is too small to characterize but is likely to represent a small proteinaceous or hemorrhagic cyst. The urinary bladder is opacified with contrast. Intraluminal mass or significant mural thickening is identified. Stomach/Bowel: No bowel obstruction or inflammation. Moderate stool retention within the rectum with stool ball measuring up to 6.5 cm. Lymphatic: No lymphadenopathy. Reproductive: Hysterectomy.  No adnexal mass. Other: No free air nor free fluid. Musculoskeletal: No acute or significant osseous findings. Review of the MIP images confirms the above findings. IMPRESSION: 1. Aortic atherosclerosis without dissection or aneurysm. Mild ectasia of the distal abdominal aorta 2.8 cm transverse. 2. No acute pulmonary embolus. 3. Centrilobular emphysema, upper lobe predominant within diffuse mild interstitial edema and bilateral dependent atelectasis. 4. Increased stool retention within the rectum query constipation/fecal impaction. Electronically Signed   By: Ashley Royalty M.D.   On: 10/25/2017 16:25    Consults: Treatment Team:  Yolonda Kida, MD Clapacs, Madie Reno, MD   Subjective:    Overnight Issues: Patient has had multiple episodes of anxiety and panics.  She states that she wants to leave the hospital.  I discussed with her waiting for cardiology consultants and psychiatry consultants to come up with a appropriate medical regimen for home discharge.  She is thinking about it  Objective:  Vital signs for last 24 hours: Temp:  [97.5 F (36.4 C)-98.9 F (37.2 C)] 98.9 F (37.2 C) (10/07 0200) Pulse Rate:  [62-96] 85 (10/07 0700) Resp:  [12-32] 22 (10/07 0700) BP: (  76-120)/(45-77) 100/63 (10/07 0700) SpO2:  [88 %-97 %] 91 % (10/07 0700) FiO2 (%):  [32 %] 32 % (10/06 1814) Weight:  [54.4 kg] 54.4 kg (10/06 1149)  Hemodynamic parameters for last 24  hours:    Intake/Output from previous day: 10/06 0701 - 10/07 0700 In: 1240 [P.O.:240; IV Piggyback:1000] Out: 950 [Urine:950]  Intake/Output this shift: No intake/output data recorded.  Vent settings for last 24 hours: FiO2 (%):  [32 %] 32 %  Physical Exam:  Patient is awake, alert, in mild discomfort, hemodynamically stable without any respiratory distress HEENT:           Trachea midline, no thyromegaly appreciated, no carotid bruits auscultated Cardiovascular:           Regular rate and rhythm Pulmonary:      Clear to auscultation Abdominal:      Positive bowel sounds, soft nontender exam Extremities:     No clubbing, cyanosis or edema noted Neurologic:      No focal deficits appreciated  Assessment/Plan:  Cardiomyopathy, status post cardiac catheterization with clean coronary vessels, ejection fraction of 20 to 25%, apical hypokinesis consistent with Taktsubo. CT angiogram was also performed  Aortic atherosclerosis without dissection or aneurysm. Mild ectasia of the distal abdominal aorta 2.8 cm transverse.No acute pulmonary embolus. Centrilobular emphysema, upper lobe predominant within diffuse mild interstitial edema and bilateral dependent atelectasis.  Troponin continues to rise at 3.12, being followed by cardiology, is on aspirin, pending cardiology recommendations for medical regimen  Leukocytosis.  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  Will DC  Anxiety/panic disorder.  Patient is on Effexor, Xanax.  Pending psychiatric consultation for outpatient regimen recommendation   Elvyn Krohn 10/26/2017  *Care during the described time interval was provided by me and/or other providers on the critical care team.  I have reviewed this patient's available data,  including medical history, events of note, physical examination and test results as part of my evaluation.

## 2017-10-26 NOTE — H&P (Signed)
[facility] [place] NAME: Bianca Reilly    MR#:  [id number]  DATE OF BIRTH:  09-15-1955  DATE OF ADMISSION:  10/26/2017  PRIMARY CARE PHYSICIAN: System, Pcp Not In   REQUESTING/REFERRING PHYSICIAN: Kerman Passey, MD  CHIEF COMPLAINT:   Chief Complaint  Patient presents with  . Chest Pain    HISTORY OF PRESENT ILLNESS:  Bianca Reilly  is a 62 y.o. female who presents with chief complaint as above.  Patient was admitted to the hospital within the last 24 hours with significant chest pain and was found in the ED to have potential STEMI.  She was taken to Cath Lab and had catheterization done which showed clean coronaries, but apical cardiomyopathy consistent with possible Takotsubo.  Is been under significant stress in her marital relationship.  She left AMA yesterday after catheterization, but came back again today with persistent chest discomfort.  Troponin is downtrending.  Hospitalist were called for admission  PAST MEDICAL HISTORY:   Past Medical History:  Diagnosis Date  . Anxiety   . Fibromyalgia   . Panic attacks   . Sinus problem      PAST SURGICAL HISTORY:   Past Surgical History:  Procedure Laterality Date  . ABDOMINAL HYSTERECTOMY    . CORONARY/GRAFT ACUTE MI REVASCULARIZATION N/A 10/25/2017   Procedure: Coronary/Graft Acute MI Revascularization;  Surgeon: Yolonda Kida, MD;  Location: [facility];  Service: Cardiovascular;  Laterality: N/A;  . LEFT HEART CATH AND CORONARY ANGIOGRAPHY N/A 10/25/2017   Procedure: LEFT HEART CATH AND CORONARY ANGIOGRAPHY;  Surgeon: Yolonda Kida, MD;  Location: [facility];  Service: Cardiovascular;  Laterality: N/A;     SOCIAL HISTORY:   Social History   Tobacco Use  . Smoking status: Current Every Day Smoker    Packs/day: 1.00  . Smokeless tobacco: Never Used  Substance Use Topics  . Alcohol use: Never    Frequency: Never     FAMILY HISTORY:   Family history reviewed and is noncontributory   DRUG ALLERGIES:   Allergies  Allergen Reactions  . Ivp Dye [Iodinated Diagnostic Agents] Rash    Per RN    MEDICATIONS AT HOME:   Prior to Admission medications   Medication Sig Start Date End Date Taking? Authorizing Provider  albuterol (PROVENTIL HFA;VENTOLIN HFA) 108 (90 Base) MCG/ACT inhaler Inhale 1-2 puffs into the lungs every 6 (six) hours as needed for wheezing or shortness of breath.    [provider]  ALPRAZolam Duanne Moron) 0.5 MG tablet Take 0.5 mg by mouth daily.    [provider]  azelastine (ASTELIN) 0.1 % nasal spray Place 2 sprays into both nostrils 2 (two) times daily. Use in each nostril as directed    [provider]  levofloxacin (LEVAQUIN) 500 MG tablet Take 500 mg by mouth daily. For 10 days    [provider]  montelukast (SINGULAIR) 10 MG tablet Take 10 mg by mouth at bedtime.    [provider]  venlafaxine (EFFEXOR) 37.5 MG tablet Take 37.5 mg by mouth daily.    [provider]    REVIEW OF SYSTEMS:  Review of Systems  Constitutional: Negative for chills, fever, malaise/fatigue and weight loss.  HENT: Negative for ear pain, hearing loss and tinnitus.   Eyes: Negative for blurred vision, double vision, pain and redness.  Respiratory: Negative for cough, hemoptysis and shortness of breath.   Cardiovascular: Positive for chest pain. Negative for palpitations, orthopnea  and leg swelling.  Gastrointestinal: Negative for abdominal pain, constipation, diarrhea, nausea and vomiting.  Genitourinary: Negative for dysuria, frequency and hematuria.  Musculoskeletal: Negative for back pain, joint pain and neck pain.  Skin:       No acne, rash, or lesions  Neurological: Negative for dizziness, tremors, focal weakness and weakness.  Endo/Heme/Allergies: Negative for polydipsia. Does not bruise/bleed easily.  Psychiatric/Behavioral: Negative for depression. The patient  is not nervous/anxious and does not have insomnia.      VITAL SIGNS:   Vitals:   10/26/17 1930 10/26/17 2000 10/26/17 2010 10/26/17 2130  BP: 109/71  109/70 96/62  Pulse: 83 81 78 79  Resp: 20 20 (!) 21 20  Temp:      TempSrc:      SpO2: 94% 93% 93% 95%  Weight:      Height:       Wt Readings from Last 3 Encounters:  10/26/17 54.4 kg  10/25/17 54.4 kg    PHYSICAL EXAMINATION:  Physical Exam  Vitals reviewed. Constitutional: She is oriented to person, place, and time. She appears well-developed and well-nourished. No distress.  HENT:  Head: Normocephalic and atraumatic.  Mouth/Throat: Oropharynx is clear and moist.  Eyes: Pupils are equal, round, and reactive to light. Conjunctivae and EOM are normal. No scleral icterus.  Neck: Normal range of motion. Neck supple. No JVD present. No thyromegaly present.  Cardiovascular: Normal rate, regular rhythm and intact distal pulses. Exam reveals no gallop and no friction rub.  No murmur heard. Respiratory: Effort normal and breath sounds normal. No respiratory distress. She has no wheezes. She has no rales.  GI: Soft. Bowel sounds are normal. She exhibits no distension. There is no tenderness.  Musculoskeletal: Normal range of motion. She exhibits no edema.  No arthritis, no gout  Lymphadenopathy:    She has no cervical adenopathy.  Neurological: She is alert and oriented to person, place, and time. No cranial nerve deficit.  No dysarthria, no aphasia  Skin: Skin is warm and dry. No rash noted. No erythema.  Psychiatric: Her behavior is normal. Judgment and thought content normal.  Depressed mood, tearful    LABORATORY PANEL:   CBC Recent Labs  Lab 10/26/17 1827  WBC 15.2*  HGB 14.4  HCT 43.4  PLT 277   ------------------------------------------------------------------------------------------------------------------  Chemistries  Recent Labs  Lab 10/25/17 1346  10/26/17 1827  NA 140   < > 139  K 3.3*   < > 3.7   CL 106   < > 105  CO2 22   < > 25  GLUCOSE 165*   < > 127*  BUN 11   < > 12  CREATININE 0.68   < > 0.59  CALCIUM 8.2*   < > 8.9  AST 19  --   --   ALT 13  --   --   ALKPHOS 36*  --   --   BILITOT 0.7  --   --    < > = values in this interval not displayed.   ------------------------------------------------------------------------------------------------------------------  Cardiac Enzymes Recent Labs  Lab 10/26/17 1827  TROPONINI 1.76*   ------------------------------------------------------------------------------------------------------------------  RADIOLOGY:  Dg Chest Portable 1 View  Result Date: 10/26/2017 CLINICAL DATA:  Pt left ama from ICU at [facility] today because her husband left her. Pt returns this afternoon stating she is scared, pt reports chest pain EXAM: PORTABLE CHEST 1 VIEW COMPARISON:  CTA, 10/25/2017 FINDINGS: Cardiac silhouette is normal in size. No mediastinal or hilar masses. No  evidence of adenopathy. Lungs are clear.  No pleural effusion or pneumothorax. Skeletal structures are grossly intact. IMPRESSION: No active disease. Electronically Signed   By: Lajean Manes M.D.   On: 10/26/2017 20:43   Ct Angio Chest/abd/pel For Dissection W And/or W/wo  Result Date: 10/25/2017 CLINICAL DATA:  62 year old female with persistent chest pain and negative cardiac catheterization. EXAM: CT ANGIOGRAPHY CHEST, ABDOMEN AND PELVIS TECHNIQUE: Multidetector CT imaging through the chest, abdomen and pelvis was performed using the standard protocol during bolus administration of intravenous contrast. Multiplanar reconstructed images and MIPs were obtained and reviewed to evaluate the vascular anatomy. Patient was premedicated prior to study and had no immediate adverse effects post IV contrast. CONTRAST:  115mL ISOVUE-370 IOPAMIDOL (ISOVUE-370) INJECTION 76% COMPARISON:  None. FINDINGS: CTA CHEST FINDINGS Cardiovascular: Noncontrast images demonstrate no evidence of acute mural  hematoma within the aorta. Aortic atherosclerosis is noted without aneurysmal dilatation. No dissection is noted allowing for motion related artifacts. Pulmonary vasculature is unremarkable. Heart size is normal. No pericardial effusion or thickening. No apparent calcific coronary arteriosclerosis. Mediastinum/Nodes: No enlarged mediastinal, hilar, or axillary lymph nodes. Thyroid gland, trachea, and esophagus demonstrate no significant findings. Lungs/Pleura: Mild interstitial edema with mild peribronchial thickening bilaterally. Centrilobular emphysema, upper lobe predominant. Bilateral dependent atelectasis. No pneumothorax. Musculoskeletal: No chest wall abnormality. No acute or significant osseous findings. Review of the MIP images confirms the above findings. CTA ABDOMEN AND PELVIS FINDINGS VASCULAR Aorta: Mild atherosclerosis of the abdominal aorta without aneurysmal dilatation. Ectasia of the distal abdominal aorta just proximal to the bifurcation measuring up to 2.8 cm transverse. Celiac: Patent without evidence of aneurysm, dissection, vasculitis or significant stenosis. SMA: Patent without evidence of aneurysm, dissection, vasculitis or significant stenosis. Renals: Two renal arteries to the right kidney and one noted to the left without significant stenosis. No evidence of aneurysm, fibromuscular dysplasia or dissection. IMA: Patent without evidence of aneurysm, dissection, vasculitis or significant stenosis. Inflow: Patent without evidence of aneurysm, dissection, vasculitis or significant stenosis. Veins: No obvious venous abnormality within the limitations of this arterial phase study. Review of the MIP images confirms the above findings. NON-VASCULAR Hepatobiliary: No focal liver abnormality is seen. No gallstones, gallbladder wall thickening, or biliary dilatation. Pancreas: Unremarkable. No pancreatic ductal dilatation or surrounding inflammatory changes. Spleen: Normal size spleen without mass.  Adrenals/Urinary Tract: Hyperdensities noted on the unenhanced images within the left renal collecting system are felt secondary to test injection of contrast. No definite renal or ureteral stones. Tiny hyperdensity seen on the unenhanced study in the interpolar aspect of the left kidney measuring approximately 2 mm is too small to characterize but is likely to represent a small proteinaceous or hemorrhagic cyst. The urinary bladder is opacified with contrast. Intraluminal mass or significant mural thickening is identified. Stomach/Bowel: No bowel obstruction or inflammation. Moderate stool retention within the rectum with stool ball measuring up to 6.5 cm. Lymphatic: No lymphadenopathy. Reproductive: Hysterectomy.  No adnexal mass. Other: No free air nor free fluid. Musculoskeletal: No acute or significant osseous findings. Review of the MIP images confirms the above findings. IMPRESSION: 1. Aortic atherosclerosis without dissection or aneurysm. Mild ectasia of the distal abdominal aorta 2.8 cm transverse. 2. No acute pulmonary embolus. 3. Centrilobular emphysema, upper lobe predominant within diffuse mild interstitial edema and bilateral dependent atelectasis. 4. Increased stool retention within the rectum query constipation/fecal impaction. Electronically Signed   By: Ashley Royalty M.D.   On: 10/25/2017 16:25    EKG:   Orders placed or performed during  the hospital encounter of 10/26/17  . ED EKG within 10 minutes  . EKG 12-Lead  . EKG 12-Lead  . ED EKG within 10 minutes    IMPRESSION AND PLAN:  Principal Problem:   Chest pain -most likely related to her cardiomyopathy.  Patient's troponin is already downtrending appropriately.  She left for being able to get a full work-up including echocardiogram.  We will complete this work-up along with a cardiology consult Active Problems:   Cardiomyopathy [facility]) -work-up as above   Anxiety -home dose anxiolytics  Chart review performed and case discussed  with ED provider. Labs, imaging and/or ECG reviewed by provider and discussed with patient/family. Management plans discussed with the patient and/or family.  DVT PROPHYLAXIS: SubQ lovenox   GI PROPHYLAXIS:  None  ADMISSION STATUS: Observation  CODE STATUS: Full Code Status History    Date Active Date Inactive Code Status Order ID Comments User Context   10/25/2017 1321 10/26/2017 1407 Full Code [id number]  Gladstone Lighter, MD Inpatient   10/25/2017 1320 10/25/2017 1320 Full Code [id number]  Yolonda Kida, MD Inpatient      TOTAL TIME TAKING CARE OF THIS PATIENT: 40 minutes.   Bianca Reilly 10/26/2017, 9:52 PM  [employer]  [phone]  CC: Primary care physician; System, Pcp Not In  Note:  This document was prepared using Dragon voice recognition software and may include unintentional dictation errors.

## 2017-10-26 NOTE — Progress Notes (Signed)
Patient alert and oriented, but tearful and anxious. Patient requested to leave AMA and signed AMA paper. PIV's were removed. Paper scrubs were given to patient as she had no belongings with her at the hospital. I attempted to contact patients niece, no answer and voicemail full. Patient did not know any other phone numbers for family members. Phone number provided in patients chart had been disconnected. MD Conforti aware patient leaving AMA. Charge RN and [facility] made aware of patient leaving AMA without any personal belongings or transportation home.

## 2017-10-26 NOTE — Progress Notes (Addendum)
Patient ID: Bianca Reilly, female   DOB: [birth year], 62 y.o.   MRN: [id number] Pulmonary/critical care attending  Patient states she needed to leave to go for an appointment Patient is oriented, communicating, understands that we do not recommend that she leave the hospital, we recommend that she has her consultants with cardiology and psychiatry to develop an outpatient regimen prior to discharge. She states she does not want to stay and she wants to leave [facility] I strongly advised against that but will of course abide by her wishes She denies any suicidal ideation or desire to hurt herself  Hermelinda Dellen, DO

## 2017-10-26 NOTE — ED Notes (Signed)
Date and time results received: 10/26/17 1914  Test: Troponin Critical Value: 1.76  Name of Provider Notified:Paduchowski MD  Orders Received? Or Actions Taken?: Pt remains on cardiac monitor. Pt alert & oriented x4. ABCs intact. NAD. VSS. Will continue to monitor.

## 2017-10-26 NOTE — Clinical Social Work Note (Signed)
CSW called by patient's nurse stating patient is wanting to leave Against Medical Advice. CSW inquired if patient was being allowed to make her own decisions and the nurse replied that she was. CSW advised that if patient wishes to leave Against Medical Advice then that was patient's right. Shela Leff MSW,LCSW [phone]

## 2017-10-26 NOTE — ED Triage Notes (Signed)
Pt to triage via wheelchair.  Pt left ama from ICU at [facility] today because her husband left her.  Pt returns this afternoon stating she is scared.  Pt crying in triage.  Pt reports chest pain.  No n/v.  Pt alert.

## 2017-10-26 NOTE — ED Provider Notes (Signed)
[facility] Emergency Department Provider Note  Time seen: 6:59 PM  I have reviewed the triage vital signs and the nursing notes.   HISTORY  Chief Complaint Chest Pain    HPI Bianca Reilly is a 62 y.o. female with a past medical history of severe anxiety, fibromyalgia, presents to the emergency department for evaluation.  According to the patient she developed significant chest pain after having argument with her husband yesterday.  Came to the emergency department with severe chest pain and ultimately was brought to the cardiac catheterization lab and admitted to the CCU.  Patient states today her anxiety was severe, they attempted to give her a Xanax upstairs but she states her anxiety got the best of her and she signed out [place] and left the hospital.  Patient states shortly after leaving she regretted her decision, she is scared about her heart and her medical conditions so she came back to the emergency department.  Patient states mild cough over the past 2 days, denies any shortness of breath.  Continues to state mild central chest pain.  Denies any current nausea or diaphoresis.   Past Medical History:  Diagnosis Date  . Anxiety   . Fibromyalgia   . Panic attacks   . Sinus problem     Patient Active Problem List   Diagnosis Date Noted  . STEMI (ST elevation myocardial infarction) ([place]) 10/25/2017  . Cardiomyopathy ([place]) 10/25/2017    Past Surgical History:  Procedure Laterality Date  . ABDOMINAL HYSTERECTOMY    . CORONARY/GRAFT ACUTE MI REVASCULARIZATION N/A 10/25/2017   Procedure: Coronary/Graft Acute MI Revascularization;  Surgeon: Yolonda Kida, MD;  Location: [facility];  Service: Cardiovascular;  Laterality: N/A;  . LEFT HEART CATH AND CORONARY ANGIOGRAPHY N/A 10/25/2017   Procedure: LEFT HEART CATH AND CORONARY ANGIOGRAPHY;  Surgeon: Yolonda Kida, MD;  Location: [facility];  Service:  Cardiovascular;  Laterality: N/A;    Prior to Admission medications   Medication Sig Start Date End Date Taking? Authorizing Provider  albuterol (PROVENTIL HFA;VENTOLIN HFA) 108 (90 Base) MCG/ACT inhaler Inhale 1-2 puffs into the lungs every 6 (six) hours as needed for wheezing or shortness of breath.    [provider]  ALPRAZolam Duanne Moron) 0.5 MG tablet Take 0.5 mg by mouth daily.    [provider]  azelastine (ASTELIN) 0.1 % nasal spray Place 2 sprays into both nostrils 2 (two) times daily. Use in each nostril as directed    [provider]  levofloxacin (LEVAQUIN) 500 MG tablet Take 500 mg by mouth daily. For 10 days    [provider]  montelukast (SINGULAIR) 10 MG tablet Take 10 mg by mouth at bedtime.    [provider]  venlafaxine (EFFEXOR) 37.5 MG tablet Take 37.5 mg by mouth daily.    [provider]    Allergies  Allergen Reactions  . Ivp Dye [Iodinated Diagnostic Agents] Rash    Per RN    No family history on file.  Social History Social History   Tobacco Use  . Smoking status: Current Every Day Smoker    Packs/day: 1.00  . Smokeless tobacco: Never Used  Substance Use Topics  . Alcohol use: Never    Frequency: Never  . Drug use: Never    Review of Systems Constitutional: Negative for fever. Cardiovascular: Mild central chest pain Respiratory: Negative for shortness of breath.  Positive for cough. Gastrointestinal: Negative for abdominal pain Genitourinary: Negative  for urinary compaints Musculoskeletal: Negative for leg pain or swelling Skin: Negative for skin complaints  Neurological: Negative for headache All other ROS negative  ____________________________________________   PHYSICAL EXAM:  VITAL SIGNS: ED Triage Vitals  Enc Vitals Group     BP 10/26/17 1824 131/81     Pulse Rate 10/26/17 1824 91     Resp 10/26/17 1824 20     Temp 10/26/17 1824 98.6 F (37 C)     Temp Source 10/26/17 1824  Oral     SpO2 10/26/17 1824 98 %     Weight 10/26/17 1825 120 lb (54.4 kg)     Height 10/26/17 1825 5\' 4"  (1.626 m)     Head Circumference --      Peak Flow --      Pain Score 10/26/17 1825 4     Pain Loc --      Pain Edu? --      Excl. in [place]? --    Constitutional: Alert and oriented. Well appearing and in no distress. Eyes: Normal exam ENT   Head: Normocephalic and atraumatic.   Mouth/Throat: Mucous membranes are moist. Cardiovascular: Normal rate, regular rhythm.  Respiratory: Normal respiratory effort without tachypnea nor retractions. Breath sounds are clear  Gastrointestinal: Soft and nontender. No distention.  Musculoskeletal: Nontender with normal range of motion in all extremities. No lower extremity tenderness or edema. Neurologic:  Normal speech and language. No gross focal neurologic deficits  Skin:  Skin is warm, dry and intact.  Psychiatric: Mood and affect are normal. Speech and behavior are normal.   ____________________________________________    EKG  EKG reviewed and interpreted by myself shows a normal sinus rhythm at 95 bpm with a narrow QRS, normal axis, largely normal intervals besides slight QTC prolongation.  Patient has significant inferolateral T wave inversions which are new compared to yesterday.  ____________________________________________    RADIOLOGY  X-ray shows no active disease  ____________________________________________   INITIAL IMPRESSION / ASSESSMENT AND PLAN / ED COURSE  Pertinent labs & imaging results that were available during my care of the patient were reviewed by me and considered in my medical decision making (see chart for details).  Patient presents emergency department for chest pain yesterday continued mild chest pain today, had a cardiac catheterization yesterday per record review appears to show significant apical hypokinesis consistent with possible Takotsubo.  Patient signed out [place] today  and left the CCU.  Patient returns continues to have mild chest pain, has significant EKG changes compared to yesterday.  We will repeat labs, obtain a chest x-ray.  I anticipate readmission to the hospital today.  X-rays negative.  Patient's labs show troponin of 1.76 although down from greater than 3.  However the patient did not even see cardiology or obtain an echocardiogram patient is in need of further work-up and will be admitted to the hospitalist service.  ____________________________________________   FINAL CLINICAL IMPRESSION(S) / ED DIAGNOSES  Chest pain Myocardial infarction    Harvest Dark, MD 10/26/17 2115

## 2017-10-26 NOTE — Progress Notes (Signed)
   10/25/17 2315  Clinical Encounter Type  Visited With Patient;Health care provider  Visit Type Follow-up   While on unit, chaplain engaged patient to assist in staff support.  Chaplain maintained a calming, non-anxious presence, utilizing active and reflective listening as patient spoke of concerns in being in hospital, concerns regarding spouse's current location and actions, desire to be out of the hospital.  Patient displayed a wide array of emotions in short periods of time: anger, tearfulness, frustration.  Some contradictions appeared in her narrative from beginning to end of encounter in her dialogues with chaplain, other staff, and family member via telephone.  Patient eventually fell asleep and chaplain followed up with staff, encouraging them to page chaplain as needed.

## 2017-10-27 LAB — BASIC METABOLIC PANEL
ANION GAP: 4 — AB (ref 5–15)
BUN: 10 mg/dL (ref 6–20)
CHLORIDE: 107 mmol/L (ref 98–111)
CO2: 31 mmol/L (ref 22–32)
Calcium: 8.5 mg/dL — ABNORMAL LOW (ref 8.9–10.3)
Creatinine, Ser: 0.5 mg/dL (ref 0.44–1.00)
GFR calc non Af Amer: >60 mL/min (ref >60)
Glucose, Bld: 119 mg/dL — ABNORMAL HIGH (ref 70–99)
Potassium: 3.4 mmol/L — ABNORMAL LOW (ref 3.5–5.1)
Sodium: 142 mmol/L (ref 135–145)

## 2017-10-27 LAB — CBC
HEMATOCRIT: 39.4 % (ref 35.0–47.0)
HEMOGLOBIN: 13.3 g/dL (ref 12.0–16.0)
MCH: 32 pg (ref 26.0–34.0)
MCHC: 33.8 g/dL (ref 32.0–36.0)
MCV: 94.7 fL (ref 80.0–100.0)
Platelets: 245 10*3/uL (ref 150–440)
RBC: 4.16 MIL/uL (ref 3.80–5.20)
RDW: 13.2 % (ref 11.5–14.5)
WBC: 9.6 10*3/uL (ref 3.6–11.0)

## 2017-10-27 LAB — HIV ANTIBODY (ROUTINE TESTING W REFLEX): HIV SCREEN 4TH GENERATION: NONREACTIVE

## 2017-10-27 LAB — IGG, IGA, IGM
IGA: 105 mg/dL (ref 87–352)
IgG (Immunoglobin G), Serum: 423 mg/dL — ABNORMAL LOW (ref 700–1600)
IgM (Immunoglobulin M), Srm: 84 mg/dL (ref 26–217)

## 2017-10-27 LAB — TROPONIN I: Troponin I: 1.39 ng/mL (ref <0.03)

## 2017-10-27 MED ORDER — CARVEDILOL 3.125 MG PO TABS
3.1250 mg | ORAL_TABLET | Freq: Two times a day (BID) | ORAL | Status: DC
  Administered 2017-10-27: 3.125 mg via ORAL
  Filled 2017-10-27: qty 1

## 2017-10-27 MED ORDER — SODIUM CHLORIDE 0.9% IV SOLUTION: Freq: Once | INTRAVENOUS | Status: DC

## 2017-10-27 MED ORDER — ALPRAZOLAM 0.5 MG PO TABS
0.5000 mg | ORAL_TABLET | Freq: Three times a day (TID) | ORAL | Status: DC | PRN
  Administered 2017-10-27: 0.5 mg via ORAL
  Filled 2017-10-27 (×2): qty 1

## 2017-10-27 NOTE — Discharge Summary (Signed)
[facility] [place] NAME: Bianca Reilly    MR#:  [id number]  DATE OF BIRTH:  May 27, 1955  DATE OF ADMISSION:  10/26/2017   ADMITTING PHYSICIAN: Harrie Foreman, MD  DATE OF DISCHARGE: 10/27/2017 11:16 AM  PRIMARY CARE PHYSICIAN: System, Pcp Not In   ADMISSION DIAGNOSIS:  Myocardial infarction, unspecified MI type, unspecified artery ([place]) [I21.9] DISCHARGE DIAGNOSIS:  Principal Problem:   Chest pain Active Problems:   Cardiomyopathy ([place])   Anxiety MI - ruled out SECONDARY DIAGNOSIS:   Past Medical History:  Diagnosis Date  . Anxiety   . Fibromyalgia   . Panic attacks   . Sinus problem    HOSPITAL COURSE:  62 y.o. female who was admitted for chest pain r/o.  Patient was admitted to the hospital within the past 24 hours with significant chest pain and was found to have potential STEMI.  She was taken to Cath Lab and had catheterization done which showed clean coronaries, but apical cardiomyopathy consistent with possible Takotsubo.  Is been under significant stress in her marital relationship.  She left AMA yesterday after catheterization, but came back again today with persistent chest discomfort.  Troponin is downtrending.   She was ruled out with serial neg troponins. She was very emotional and wanting to leave. Refused psych eval. She is oriented and alert. She mentions she is safe but don't have real place to go. [place] [employer] where her niece will pick her up (per patient). She understands the risks and benefits in leaving but adamant in wanting to leave. DISCHARGE CONDITIONS:  stable CONSULTS OBTAINED:  Treatment Team:  Teodoro Spray, MD DRUG ALLERGIES:   Allergies  Allergen Reactions  . Ivp Dye [Iodinated Diagnostic Agents] Rash    Per RN   DISCHARGE MEDICATIONS:   Allergies as of 10/27/2017      Reactions   Ivp Dye [iodinated Diagnostic Agents] Rash   Per RN      Medication List    STOP taking  these medications   levofloxacin 500 MG tablet Commonly known as:  LEVAQUIN     TAKE these medications   albuterol 108 (90 Base) MCG/ACT inhaler Commonly known as:  PROVENTIL HFA;VENTOLIN HFA Inhale 1-2 puffs into the lungs every 6 (six) hours as needed for wheezing or shortness of breath.   ALPRAZolam 0.5 MG tablet Commonly known as:  XANAX Take 0.5 mg by mouth daily.   azelastine 0.1 % nasal spray Commonly known as:  ASTELIN Place 2 sprays into both nostrils 2 (two) times daily. Use in each nostril as directed   montelukast 10 MG tablet Commonly known as:  SINGULAIR Take 10 mg by mouth at bedtime.   venlafaxine 37.5 MG tablet Commonly known as:  EFFEXOR Take 37.5 mg by mouth daily.        DISCHARGE INSTRUCTIONS:   DIET:  Regular diet DISCHARGE CONDITION:  Good ACTIVITY:  Activity as tolerated OXYGEN:  Home Oxygen: No.  Oxygen Delivery: room air DISCHARGE LOCATION:  home   If you experience worsening of your admission symptoms, develop shortness of breath, life threatening emergency, suicidal or homicidal thoughts you must seek medical attention immediately by calling 911 or calling your MD immediately  if symptoms less severe.  You Must read complete instructions/literature along with all the possible adverse reactions/side effects for all the Medicines you take and that have been prescribed to you. Take any new Medicines after you have completely understood and  accpet all the possible adverse reactions/side effects.   Please note  You were cared for by a hospitalist during your hospital stay. If you have any questions about your discharge medications or the care you received while you were in the hospital after you are discharged, you can call the unit and asked to speak with the hospitalist on call if the hospitalist that took care of you is not available. Once you are discharged, your primary care physician will handle any further medical issues. Please note  that NO REFILLS for any discharge medications will be authorized once you are discharged, as it is imperative that you return to your primary care physician (or establish a relationship with a primary care physician if you do not have one) for your aftercare needs so that they can reassess your need for medications and monitor your lab values.    On the day of Discharge:  VITAL SIGNS:  Blood pressure 115/72, pulse 82, temperature 97.9 F (36.6 C), resp. rate 18, height 5\' 4"  (1.626 m), weight 54.4 kg, SpO2 98 %. PHYSICAL EXAMINATION:  GENERAL:  62 y.o.-year-old patient lying in the bed with no acute distress.  EYES: Pupils equal, round, reactive to light and accommodation. No scleral icterus. Extraocular muscles intact.  HEENT: Head atraumatic, normocephalic. Oropharynx and nasopharynx clear.  NECK:  Supple, no jugular venous distention. No thyroid enlargement, no tenderness.  LUNGS: Normal breath sounds bilaterally, no wheezing, rales,rhonchi or crepitation. No use of accessory muscles of respiration.  CARDIOVASCULAR: S1, S2 normal. No murmurs, rubs, or gallops.  ABDOMEN: Soft, non-tender, non-distended. Bowel sounds present. No organomegaly or mass.  EXTREMITIES: No pedal edema, cyanosis, or clubbing.  NEUROLOGIC: Cranial nerves II through XII are intact. Muscle strength 5/5 in all extremities. Sensation intact. Gait not checked.  PSYCHIATRIC: The patient is alert and oriented x 3.  SKIN: No obvious rash, lesion, or ulcer.  DATA REVIEW:   CBC Recent Labs  Lab 10/27/17 0308  WBC 9.6  HGB 13.3  HCT 39.4  PLT 245    Chemistries  Recent Labs  Lab 10/25/17 1346  10/27/17 0308  NA 140   < > 142  K 3.3*   < > 3.4*  CL 106   < > 107  CO2 22   < > 31  GLUCOSE 165*   < > 119*  BUN 11   < > 10  CREATININE 0.68   < > 0.50  CALCIUM 8.2*   < > 8.5*  AST 19  --   --   ALT 13  --   --   ALKPHOS 36*  --   --   BILITOT 0.7  --   --    < > = values in this interval not displayed.      Microbiology Results  Results for orders placed or performed during the hospital encounter of 10/25/17  MRSA PCR Screening     Status: None   Collection Time: 10/25/17  2:07 PM  Result Value Ref Range Status   MRSA by PCR NEGATIVE NEGATIVE Final    Comment:        The GeneXpert MRSA Assay (FDA approved for NASAL specimens only), is one component of a comprehensive MRSA colonization surveillance program. It is not intended to diagnose MRSA infection nor to guide or monitor treatment for MRSA infections. Performed at [facility], [place], [place] [address]   CULTURE, BLOOD (ROUTINE X 2) w Reflex to ID Panel  Status: None (Preliminary result)   Collection Time: 10/25/17  4:51 PM  Result Value Ref Range Status   Specimen Description BLOOD LAC  Final   Special Requests   Final    BOTTLES DRAWN AEROBIC AND ANAEROBIC Blood Culture results may not be optimal due to an excessive volume of blood received in culture bottles   Culture   Final    NO GROWTH 2 DAYS Performed at [facility], [address], [place], [place] [address]    Report Status PENDING  Incomplete  CULTURE, BLOOD (ROUTINE X 2) w Reflex to ID Panel     Status: None (Preliminary result)   Collection Time: 10/25/17  4:53 PM  Result Value Ref Range Status   Specimen Description BLOOD LFOA  Final   Special Requests   Final    BOTTLES DRAWN AEROBIC AND ANAEROBIC Blood Culture results may not be optimal due to an excessive volume of blood received in culture bottles   Culture   Final    NO GROWTH 2 DAYS Performed at [facility], [address], [place], [place] [address]    Report Status PENDING  Incomplete    RADIOLOGY:  Dg Chest Portable 1 View  Result Date: 10/26/2017 CLINICAL DATA:  Pt left ama from ICU at [facility] today because her husband left her. Pt returns this afternoon stating she is scared, pt reports chest pain EXAM: PORTABLE CHEST 1 VIEW COMPARISON:   CTA, 10/25/2017 FINDINGS: Cardiac silhouette is normal in size. No mediastinal or hilar masses. No evidence of adenopathy. Lungs are clear.  No pleural effusion or pneumothorax. Skeletal structures are grossly intact. IMPRESSION: No active disease. Electronically Signed   By: Lajean Manes M.D.   On: 10/26/2017 20:43     Management plans discussed with the patient, nursing and they are in agreement.  CODE STATUS: Prior   TOTAL TIME TAKING CARE OF THIS PATIENT: 45 minutes.    Max Sane M.D on 10/27/2017 at 4:39 PM  Between 7am to 6pm - Pager - [phone]  After 6pm go to [email or web address] - Proofreader  [facility]  Office  [phone]  CC: Primary care physician; System, Pcp Not In   Note: This dictation was prepared with Dragon dictation along with smaller phrase technology. Any transcriptional errors that result from this process are unintentional.

## 2017-10-27 NOTE — Progress Notes (Signed)
Patient stated that she wanted to leave and go outside. Dr. Marcille Blanco on unit and came to speak with patient. Patient continues to be very anxious and tearful about husband and living situation. Dr. Marcille Blanco currently still in room with patient, will continue to assess. Wilnette Kales

## 2017-10-27 NOTE — Progress Notes (Signed)
Pt is extremely anxious this morning refusing any anti anxiety medication at this time, she is insistent upon going outside to smoke a cigarette. MD in room at bedside, with concerns for patient safety, pt states she needs to "get to [place] and get her life back together." She states that she will go to [employer] and wait for her niece to pick her up this afternoon. This RN has offered support, and educated patient about our social workers and Educational psychologist that could help her with transportation, and medications however patient is insistent that she leave this morning and refuses to wait for these interventions as well as any pyshchiatric counseling.

## 2017-10-27 NOTE — Plan of Care (Signed)
  Problem: Clinical Measurements: Goal: Respiratory complications will improve Outcome: Progressing Goal: Cardiovascular complication will be avoided Outcome: Progressing   Problem: Coping: Goal: Level of anxiety will decrease Outcome: Not Progressing

## 2017-10-27 NOTE — Discharge Instructions (Signed)
Stress and Stress Management °Stress is a normal reaction to life events. It is what you feel when life demands more than you are used to or more than you can handle. Some stress can be useful. For example, the stress reaction can help you catch the last bus of the day, study for a test, or meet a deadline at work. But stress that occurs too often or for too long can cause problems. It can affect your emotional health and interfere with relationships and normal daily activities. Too much stress can weaken your immune system and increase your risk for physical illness. If you already have a medical problem, stress can make it worse. °What are the causes? °All sorts of life events may cause stress. An event that causes stress for one person may not be stressful for another person. Major life events commonly cause stress. These may be positive or negative. Examples include losing your job, moving into a new home, getting married, having a baby, or losing a loved one. Less obvious life events may also cause stress, especially if they occur day after day or in combination. Examples include working long hours, driving in traffic, caring for children, being in debt, or being in a difficult relationship. °What are the signs or symptoms? °Stress may cause emotional symptoms including, the following: °· Anxiety. This is feeling worried, afraid, on edge, overwhelmed, or out of control. °· Anger. This is feeling irritated or impatient. °· Depression. This is feeling sad, down, helpless, or guilty. °· Difficulty focusing, remembering, or making decisions. ° °Stress may cause physical symptoms, including the following: °· Aches and pains. These may affect your head, neck, back, stomach, or other areas of your body. °· Tight muscles or clenched jaw. °· Low energy or trouble sleeping. ° °Stress may cause unhealthy behaviors, including the following: °· Eating to feel better (overeating) or skipping meals. °· Sleeping too little,  too much, or both. °· Working too much or putting off tasks (procrastination). °· Smoking, drinking alcohol, or using drugs to feel better. ° °How is this diagnosed? °Stress is diagnosed through an assessment by your health care provider. Your health care provider will ask questions about your symptoms and any stressful life events. Your health care provider will also ask about your medical history and may order blood tests or other tests. Certain medical conditions and medicine can cause physical symptoms similar to stress. Mental illness can cause emotional symptoms and unhealthy behaviors similar to stress. Your health care provider may refer you to a mental health professional for further evaluation. °How is this treated? °Stress management is the recommended treatment for stress. The goals of stress management are reducing stressful life events and coping with stress in healthy ways. °Techniques for reducing stressful life events include the following: °· Stress identification. Self-monitor for stress and identify what causes stress for you. These skills may help you to avoid some stressful events. °· Time management. Set your priorities, keep a calendar of events, and learn to say “no.” These tools can help you avoid making too many commitments. ° °Techniques for coping with stress include the following: °· Rethinking the problem. Try to think realistically about stressful events rather than ignoring them or overreacting. Try to find the positives in a stressful situation rather than focusing on the negatives. °· Exercise. Physical exercise can release both physical and emotional tension. The key is to find a form of exercise you enjoy and do it regularly. °· Relaxation techniques. These relax the body and   mind. Examples include yoga, meditation, tai chi, biofeedback, deep breathing, progressive muscle relaxation, listening to music, being out in nature, journaling, and other hobbies. Again, the key is to find  one or more that you enjoy and can do regularly. °· Healthy lifestyle. Eat a balanced diet, get plenty of sleep, and do not smoke. Avoid using alcohol or drugs to relax. °· Strong support network. Spend time with family, friends, or other people you enjoy being around. Express your feelings and talk things over with someone you trust. ° °Counseling or talktherapy with a mental health professional may be helpful if you are having difficulty managing stress on your own. Medicine is typically not recommended for the treatment of stress. Talk to your health care provider if you think you need medicine for symptoms of stress. °Follow these instructions at home: °· Keep all follow-up visits as directed by your health care provider. °· Take all medicines as directed by your health care provider. °Contact a health care provider if: °· Your symptoms get worse or you start having new symptoms. °· You feel overwhelmed by your problems and can no longer manage them on your own. °Get help right away if: °· You feel like hurting yourself or someone else. °This information is not intended to replace advice given to you by your health care provider. Make sure you discuss any questions you have with your health care provider. °Document Released: 07/02/2000 Document Revised: 06/14/2015 Document Reviewed: 08/31/2012 °Elsevier Interactive Patient Education © 2017 [employer] ° °

## 2017-10-28 ENCOUNTER — Encounter: Payer: Self-pay | Admitting: Gastroenterology

## 2017-11-03 LAB — CULTURE, BLOOD (ROUTINE X 2)
CULTURE: NO GROWTH
Culture: NO GROWTH

## 2020-04-26 IMAGING — DX DG CHEST 1V PORT
1 series · 1 of 1 positions shown · non-contrast
Comparison: CTA, 10/25/2017

CLINICAL DATA: Pt left ama from ICU at [HOSPITAL] today because her
husband left her. Pt returns this afternoon stating she is scared,
pt reports chest pain

EXAM:
PORTABLE CHEST 1 VIEW

[chest ap]
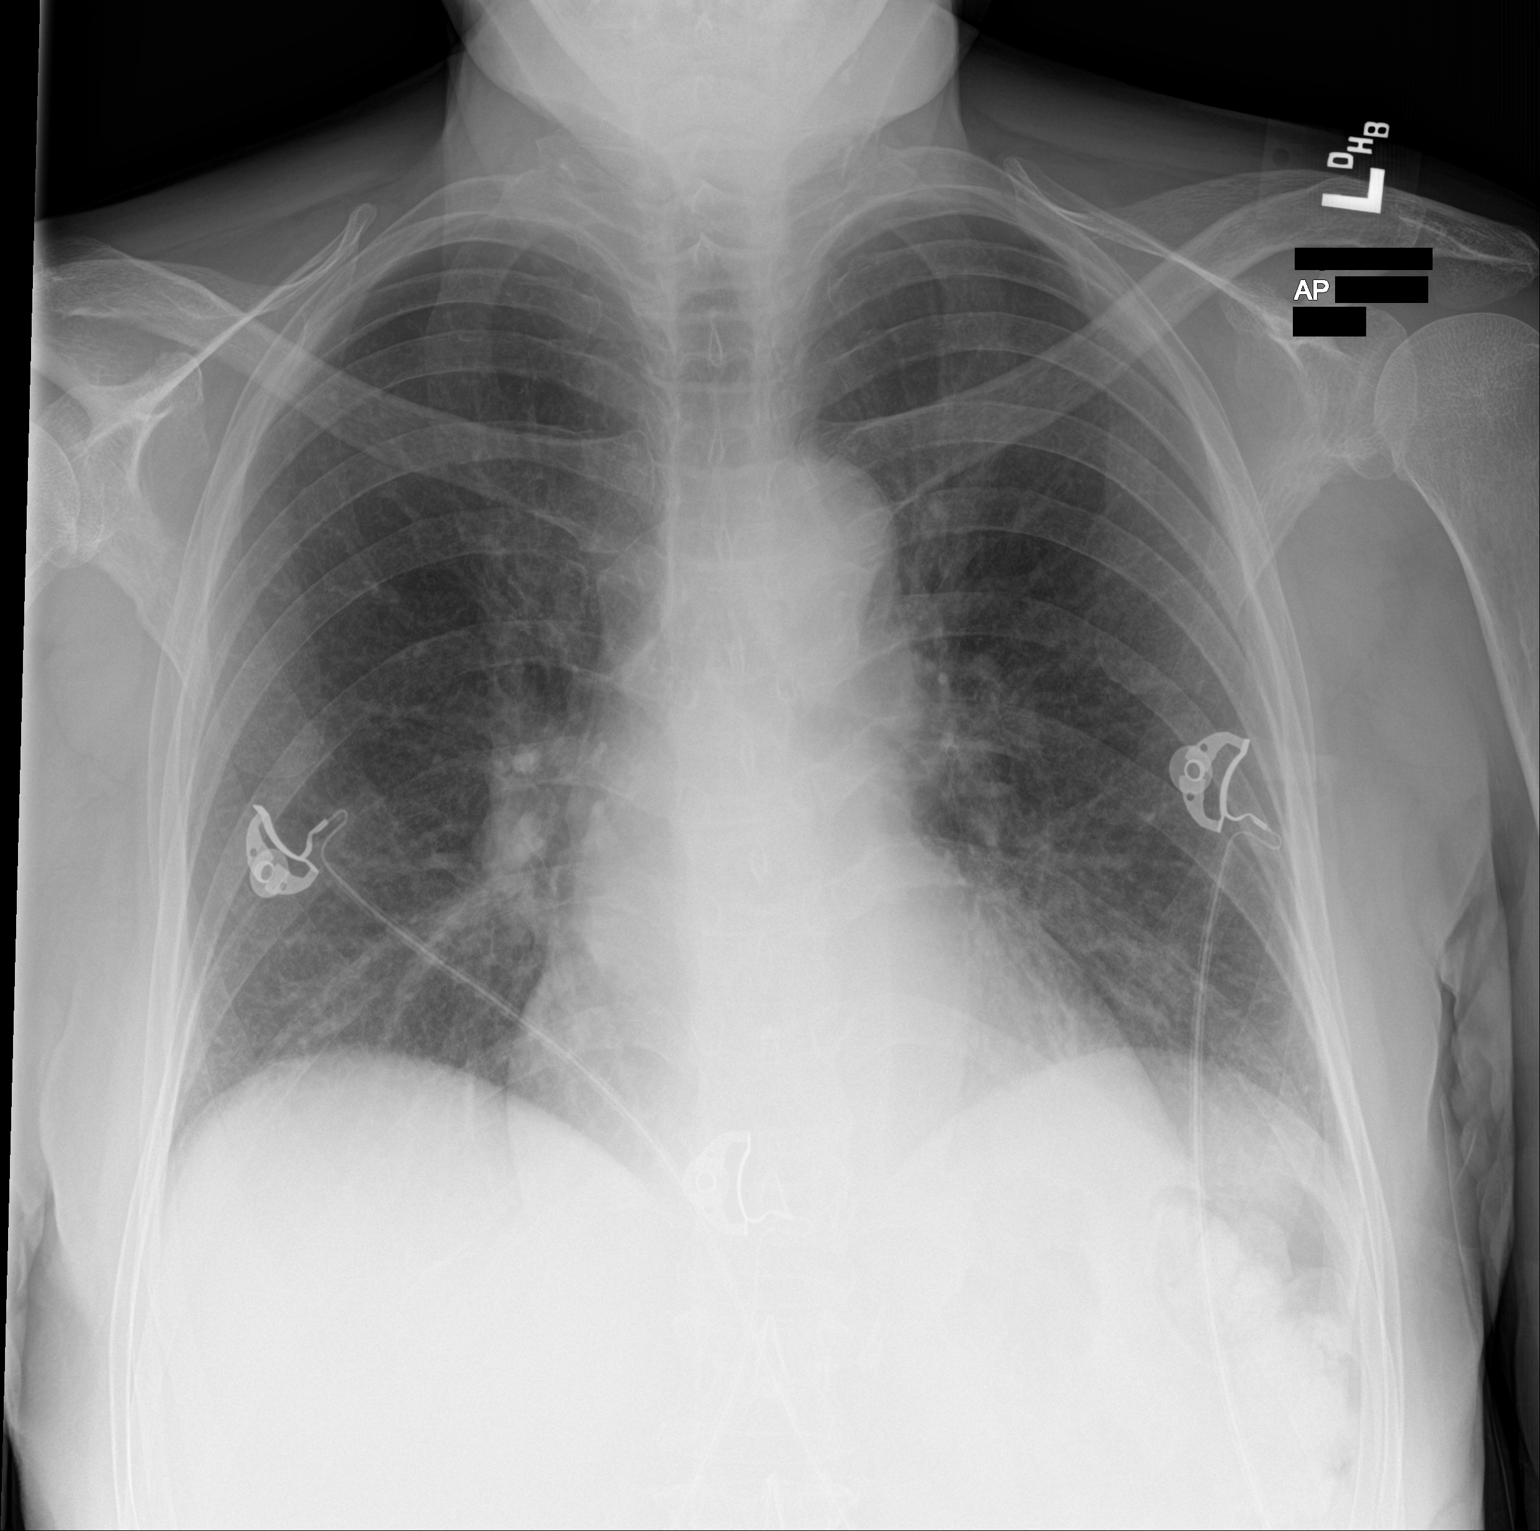

[1 of 1 positions shown; findings below may reference images not displayed]

FINDINGS: Cardiac silhouette is normal in size. No mediastinal or hilar
masses. No evidence of adenopathy.

Lungs are clear.  No pleural effusion or pneumothorax.

Skeletal structures are grossly intact.
IMPRESSION: No active disease.

## 2021-11-07 ENCOUNTER — Encounter: Payer: Self-pay | Admitting: Internal Medicine
